# Patient Record
Sex: Male | Born: 1985
Health system: Southern US, Community
[De-identification: ages and names within clinical notes are randomized; demographics above are authoritative.]

## PROBLEM LIST (undated history)

## (undated) DIAGNOSIS — I809 Phlebitis and thrombophlebitis of unspecified site: Secondary | ICD-10-CM

## (undated) DIAGNOSIS — I456 Pre-excitation syndrome: Secondary | ICD-10-CM

## (undated) DIAGNOSIS — F3181 Bipolar II disorder: Secondary | ICD-10-CM

## (undated) HISTORY — PX: TONSILLECTOMY: SUR1361

---
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
## 2005-11-22 ENCOUNTER — Inpatient Hospital Stay (HOSPITAL_COMMUNITY): Admission: RE | Admit: 2005-11-22 | Discharge: 2005-11-29 | Payer: Self-pay | Admitting: Psychiatry

## 2005-11-23 ENCOUNTER — Ambulatory Visit: Payer: Self-pay | Admitting: Psychiatry

## 2005-12-05 ENCOUNTER — Ambulatory Visit (HOSPITAL_COMMUNITY): Payer: Self-pay | Admitting: Psychiatry

## 2005-12-06 ENCOUNTER — Emergency Department (HOSPITAL_COMMUNITY): Admission: EM | Admit: 2005-12-06 | Discharge: 2005-12-06 | Payer: Self-pay | Admitting: Emergency Medicine

## 2005-12-12 ENCOUNTER — Ambulatory Visit (HOSPITAL_COMMUNITY): Payer: Self-pay | Admitting: Psychiatry

## 2005-12-19 ENCOUNTER — Ambulatory Visit (HOSPITAL_COMMUNITY): Payer: Self-pay | Admitting: Psychiatry

## 2006-01-02 ENCOUNTER — Ambulatory Visit (HOSPITAL_COMMUNITY): Payer: Self-pay | Admitting: Psychiatry

## 2006-01-09 ENCOUNTER — Ambulatory Visit (HOSPITAL_COMMUNITY): Payer: Self-pay | Admitting: Psychiatry

## 2006-01-16 ENCOUNTER — Ambulatory Visit (HOSPITAL_COMMUNITY): Payer: Self-pay | Admitting: Psychiatry

## 2006-01-23 ENCOUNTER — Ambulatory Visit (HOSPITAL_COMMUNITY): Payer: Self-pay | Admitting: Psychiatry

## 2006-01-30 ENCOUNTER — Ambulatory Visit (HOSPITAL_COMMUNITY): Payer: Self-pay | Admitting: Psychiatry

## 2009-11-13 ENCOUNTER — Emergency Department (HOSPITAL_BASED_OUTPATIENT_CLINIC_OR_DEPARTMENT_OTHER): Admission: EM | Admit: 2009-11-13 | Discharge: 2009-11-14 | Payer: Self-pay | Admitting: Emergency Medicine

## 2009-11-14 ENCOUNTER — Ambulatory Visit: Payer: Self-pay | Admitting: Diagnostic Radiology

## 2011-02-23 NOTE — Discharge Summary (Signed)
NAME:  Jordan Lamb, Jordan Lamb NO.:  [id number]   MEDICAL RECORD NO.:  [id number]          PATIENT TYPE:  IPS   LOCATION:  0507                          FACILITY:  [facility]   PHYSICIAN:  Geoffery Lyons, M.D.      DATE OF BIRTH:  [birth year]   DATE OF ADMISSION:  11/22/2005  DATE OF DISCHARGE:  11/29/2005                                 DISCHARGE SUMMARY   CHIEF COMPLAINT AND PRESENT ILLNESS:  This was the first admission to [facility] for this 25 year old white male, single, voluntarily  admitted.  Endorsed life is bullshit; I wish I were dead.  He was brought  to the hospital by his father.  He lost it.  Expressed desire to die.  Long  history of dysfunction.  He was expelled from high school due to anger.  He  is banned from [facility] campus due to anger outbursts.  He claimed that he was  tired of it all and that he wished he was dead.  The mother is gay and came  out when he was 45.  He lives with her and her mate.  He claimed that the  mother's partner has been trying to get him kicked out of the house.  Things  have been escalating.  He had five charges pending.  Lost his license, car  was totaled, charged with possession of weed, spray paint at school,  conspiracy, trespassing, DUI.  Endorsed that he did not do these things but  he is paying for it.  He apparently has been having angry outbursts for six  months.  Claimed he had tried to kill himself before, overdosing,  suffocating.   PAST PSYCHIATRIC HISTORY:  First time at [facility].  No previous  treatment.  Had been diagnosed ADHD.  Had no previous inpatient treatment.  Has been diagnosed ADHD.  Has been on Concerta and Effexor.   ALCOHOL/DRUG HISTORY:  As already stated, denies active use of alcohol.  Does admit to marijuana, 2 blunts daily since age 79.  Only one experience  with cocaine when he was 18.   MEDICAL HISTORY:  Noncontributory.   MEDICATIONS:  None.   PHYSICAL EXAMINATION:   Performed and failed to show any acute findings.   LABORATORY DATA:  CBC with white blood cells 9.1, hemoglobin 16.7.  Blood  chemistry with sodium 136, potassium 3.7, glucose 35.  Liver enzymes with  SGOT 24, SGPT 19, total bilirubin 1.1, TSH 1.277.   MENTAL STATUS EXAM:  Fully alert male.  Irritable.  Somewhat disheveled.  Poor hygiene.  Speech normal in pace and tone.  Somewhat reserved, somewhat  guarded, pretty irritable.  Thought processes are logical and coherent.  Anger, fear of losing control.  Endorsed that his anger got out of control  and he even gets afraid of his own anger.  Aggressive ideas towards family  members, or anyone that would be in the way.   ADMISSION DIAGNOSES:  AXIS I:  Mood disorder not otherwise specified.  Attention-deficit hyperactivity disorder, marijuana abuse.  AXIS II:  No diagnosis.  AXIS III:  No diagnosis.  AXIS IV:  Moderate.  AXIS V:  GAF upon admission 32; highest GAF in the last year 60.   HOSPITAL COURSE:  He was admitted.  He was started in individual and group  psychotherapy.  He was given some Seroquel.  He was started on Depakote and  he was later started on Strattera.  He endorsed he had been diagnosed with  ADHD, history of anger spells, destructive, increased use of marijuana to  cope, to calm himself down, multiple past and recent events, parents got a  divorced after the mother got involved in a same-sex relationship.  Ongoing  conflict with the father and more recently conflict with the mother's  partner.  He was kicked out of school due to aggressive outbursts.  Family  history for mood disorder and substance abuse.  Older brother in recovery.  He was insightful in that he allows his family to trigger his anger.  Felt  that the father treated him like a child.  He went off on his father while  in the unit.  Very resentful.  Got upset with himself.  While in the unit,  he had a couple of episodes where he was up to a point of  losing control.  He was allowed to go back to his room and he was allowed to get himself back  together.  We addressed anger management.  We worked with the Depakote.  We  worked with the [employer].  There was a family session that showed some of  the dynamics that they went through and how they triggered each other.  They  decided they were willing to start all over and work on the relationships.  He continued to work on self-control, identifying the triggers.  He was more  euthymic.  He was able to get into individual and the group setting.  He was  able to participate.  On November 28, 2005, he endorsed he was committed to  make this work.  He felt he made a lot of progress.  It was felt he had  obtained full benefit from the hospitalization.  Understood that he needed  to continue to work on his anger but he was willing to continue the  medication.  He was in full contact with reality.  No active suicidal or  homicidal ideation.  Going to stay with his mother but eventually, once he  got tax return, he was going to get a car and he was going to move out and  continue to work at [employer] place and go back to school, get his diploma.  Eventually would like to pursue music at [employer].   DISCHARGE DIAGNOSES:  AXIS I:  Mood disorder not otherwise specified.  Attention-deficit hyperactivity disorder.  Marijuana abuse.  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  AXIS II:  No diagnosis.  AXIS III:  No diagnosis.  AXIS IV:  Moderate.  AXIS V:  GAF upon discharge 50.   DISCHARGE MEDICATIONS:  1.  Chantix 1 mg twice a day.  2.  Seroquel 100 mg at night.  3.  Depakote ER 250 mg twice a day, 500 mg at bedtime.  4.  Strattera 40 mg, 2 in the morning.   FOLLOW UP:  [facility].      Geoffery Lyons, M.D.  Electronically Signed     IL/MEDQ  D:  12/10/2005  T:  12/10/2005  Job:  [id number]

## 2011-07-08 ENCOUNTER — Encounter: Payer: Self-pay | Admitting: Emergency Medicine

## 2011-07-08 ENCOUNTER — Emergency Department (INDEPENDENT_AMBULATORY_CARE_PROVIDER_SITE_OTHER): Payer: 59

## 2011-07-08 ENCOUNTER — Emergency Department (HOSPITAL_BASED_OUTPATIENT_CLINIC_OR_DEPARTMENT_OTHER)
Admission: EM | Admit: 2011-07-08 | Discharge: 2011-07-08 | Disposition: A | Discharge status: Home / Self Care | Payer: 59 | Attending: Emergency Medicine | Admitting: Emergency Medicine

## 2011-07-08 DIAGNOSIS — W19XXXA Unspecified fall, initial encounter: Secondary | ICD-10-CM | POA: Insufficient documentation

## 2011-07-08 DIAGNOSIS — S335XXA Sprain of ligaments of lumbar spine, initial encounter: Secondary | ICD-10-CM | POA: Insufficient documentation

## 2011-07-08 DIAGNOSIS — S39012A Strain of muscle, fascia and tendon of lower back, initial encounter: Secondary | ICD-10-CM

## 2011-07-08 DIAGNOSIS — M545 Low back pain, unspecified: Secondary | ICD-10-CM

## 2011-07-08 HISTORY — DX: Phlebitis and thrombophlebitis of unspecified site: I80.9

## 2011-07-08 HISTORY — DX: Bipolar II disorder: F31.81

## 2011-07-08 MED ORDER — OXYCODONE-ACETAMINOPHEN 5-325 MG PO TABS: 1.0000 | ORAL_TABLET | Freq: Four times a day (QID) | ORAL | Status: AC | PRN

## 2011-07-08 NOTE — ED Provider Notes (Signed)
History     CSN: [id number] Arrival date & time: 07/08/2011 10:28 AM Patient seen at 10:28 AM Chief Complaint  Patient presents with  . Back Pain    (Consider location/radiation/quality/duration/timing/severity/associated sxs/prior treatment) Patient is a 25 y.o. male presenting with back pain. The history is provided by the patient.  Back Pain  This is a new problem. The current episode started more than 1 week ago. The problem occurs constantly. The problem has been gradually worsening. The pain is associated with falling. The pain is present in the lumbar spine. The quality of the pain is described as aching. The pain is moderate. The symptoms are aggravated by certain positions. Pertinent negatives include no chest pain, no fever, no numbness, no abdominal pain, no bowel incontinence, no bladder incontinence, no leg pain, no paresthesias, no paresis and no weakness. He has tried NSAIDs for the symptoms. The treatment provided no relief.   no history of back surgery  Past Medical History  Diagnosis Date  . Superficial thrombophlebitis     History reviewed. No pertinent past surgical history.  History reviewed. No pertinent family history.  History  Substance Use Topics  . Smoking status: Current Everyday Smoker -- 1.0 packs/day    Types: Cigarettes  . Smokeless tobacco: Not on file  . Alcohol Use: No      Review of Systems  Constitutional: Negative for fever.  Cardiovascular: Negative for chest pain.  Gastrointestinal: Negative for abdominal pain and bowel incontinence.  Genitourinary: Negative for bladder incontinence.  Musculoskeletal: Positive for back pain.  Neurological: Negative for weakness, numbness and paresthesias.  All other systems reviewed and are negative.    Allergies  Review of patient's allergies indicates not on file.  Home Medications  No current outpatient prescriptions on file.  BP 151/77  Pulse 91  Resp 16  SpO2 100%  Physical  Exam CONSTITUTIONAL: Well developed/well nourished HEAD AND FACE: Normocephalic/atraumatic EYES: EOMI/PERRL ENMT: Mucous membranes moist NECK: supple no meningeal signs SPINE: Cervical and thoracic spine nontender, lumbar spine tender, lumbar paraspinal tenderness No bruising crepitance or step-offs CV: S1/S2 noted, no murmurs/rubs/gallops noted LUNGS: Lungs are clear to auscultation bilaterally, no apparent distress ABDOMEN: soft, nontender, no rebound or guarding GU:no cva tenderness, no bruising NEURO: Pt is awake/alert, moves all extremitiesx4  equal distal motor: hip flexion/knee flexion/extension, ankle dorsi/plantar flexion, great toe extension intact bilaterally, Pt is able to ambulate.  He is able to stand on heels/toes without difficulty EXTREMITIES: pulses normal, full ROM SKIN: warm, color normal PSYCH: no abnormalities of mood noted    ED Course  Procedures (including critical care time)   MDM  Nursing notes reviewed and considered in documentation xrays reviewed and considered  Pt driving does not want pain med at this time 10:36 AM        Joya Gaskins, MD 07/08/11 1104

## 2011-07-08 NOTE — ED Notes (Signed)
Care plan and use of meds for pain reviewed Pt verbalizes care well

## 2011-07-08 NOTE — ED Notes (Signed)
Lower back pain x 2 weeks.  Pt states he performs yard work and has been taking Ibuprofen without much help.  No numbness or tingling in legs.  No difficulty with elimination.   Moves all extremeties well.

## 2011-07-26 ENCOUNTER — Emergency Department (INDEPENDENT_AMBULATORY_CARE_PROVIDER_SITE_OTHER): Payer: Worker's Compensation

## 2011-07-26 ENCOUNTER — Encounter (HOSPITAL_BASED_OUTPATIENT_CLINIC_OR_DEPARTMENT_OTHER): Payer: Self-pay | Admitting: *Deleted

## 2011-07-26 ENCOUNTER — Emergency Department (HOSPITAL_BASED_OUTPATIENT_CLINIC_OR_DEPARTMENT_OTHER)
Admission: EM | Admit: 2011-07-26 | Discharge: 2011-07-26 | Disposition: A | Discharge status: Home / Self Care | Payer: Worker's Compensation | Attending: Emergency Medicine | Admitting: Emergency Medicine

## 2011-07-26 DIAGNOSIS — S46911A Strain of unspecified muscle, fascia and tendon at shoulder and upper arm level, right arm, initial encounter: Secondary | ICD-10-CM

## 2011-07-26 DIAGNOSIS — IMO0002 Reserved for concepts with insufficient information to code with codable children: Secondary | ICD-10-CM | POA: Insufficient documentation

## 2011-07-26 DIAGNOSIS — X58XXXA Exposure to other specified factors, initial encounter: Secondary | ICD-10-CM | POA: Insufficient documentation

## 2011-07-26 DIAGNOSIS — F319 Bipolar disorder, unspecified: Secondary | ICD-10-CM | POA: Insufficient documentation

## 2011-07-26 DIAGNOSIS — M25519 Pain in unspecified shoulder: Secondary | ICD-10-CM

## 2011-07-26 DIAGNOSIS — F17200 Nicotine dependence, unspecified, uncomplicated: Secondary | ICD-10-CM | POA: Insufficient documentation

## 2011-07-26 MED ORDER — HYDROCODONE-ACETAMINOPHEN 5-325 MG PO TABS: 2.0000 | ORAL_TABLET | ORAL | Status: AC | PRN

## 2011-07-26 NOTE — ED Provider Notes (Signed)
History     CSN: [id number] Arrival date & time: 07/26/2011 12:47 PM   First MD Initiated Contact with Patient 07/26/11 1315      Chief Complaint  Patient presents with  . Shoulder Pain    (Consider location/radiation/quality/duration/timing/severity/associated sxs/prior treatment) Patient is a 25 y.o. male presenting with shoulder pain and shoulder injury.  Shoulder Pain  Shoulder Injury This is a new problem. The current episode started yesterday. The problem occurs 2 to 4 times per day. The problem has been gradually worsening. The symptoms are aggravated by nothing. He has tried nothing for the symptoms. The treatment provided moderate relief.    Past Medical History  Diagnosis Date  . Superficial thrombophlebitis   . Bipolar 2 disorder     History reviewed. No pertinent past surgical history.  History reviewed. No pertinent family history.  History  Substance Use Topics  . Smoking status: Current Everyday Smoker -- 1.0 packs/day    Types: Cigarettes  . Smokeless tobacco: Not on file  . Alcohol Use: No      Review of Systems  All other systems reviewed and are negative.    Allergies  Review of patient's allergies indicates no known allergies.  Home Medications   Current Outpatient Rx  Name Route Sig Dispense Refill  . ASPIRIN EC 81 MG PO TBEC Oral Take 81 mg by mouth daily.      . OMEGA-3 FATTY ACIDS 1000 MG PO CAPS Oral Take 2 g by mouth daily.      . IBUPROFEN 800 MG PO TABS Oral Take 800 mg by mouth every 8 (eight) hours as needed.      Marland Kitchen LAMOTRIGINE 100 MG PO TABS Oral Take 100 mg by mouth daily.      . MULTI-VITAMIN/MINERALS PO TABS Oral Take 1 tablet by mouth daily.        BP 144/73  Pulse 86  Resp 20  SpO2 99%  Physical Exam  Nursing note and vitals reviewed. Constitutional: He is oriented to person, place, and time. He appears well-developed and well-nourished.  HENT:  Head: Normocephalic and atraumatic.  Eyes: Conjunctivae are  normal. Pupils are equal, round, and reactive to light.  Neck: Normal range of motion. Neck supple.  Abdominal: Bowel sounds are normal.  Musculoskeletal: He exhibits tenderness.       Tender right shoulder,  From, ns nadnv intact  Neurological: He is alert and oriented to person, place, and time.    ED Course  Procedures (including critical care time Labs Reviewed - No data to display Dg Shoulder Right  07/26/2011  *RADIOLOGY REPORT*  Clinical Data: Right shoulder pain.  RIGHT SHOULDER - 2+ VIEW  Comparison: None  Findings: The joint spaces are maintained.  No acute bony findings. The right lung apex is clear.  IMPRESSION: No fracture or dislocation.  Original Report Authenticated By: P. Loralie Champagne, M.D.     No diagnosis found.    MDM  Pt counseled on need to follow up with orthopaedist        Langston Masker, [place] 07/26/11 1417

## 2011-07-26 NOTE — ED Notes (Signed)
Right shoulder dislocated states it "slipped out " twice yesterday and again about 3 hours ago

## 2011-07-26 NOTE — ED Provider Notes (Signed)
Medical screening examination/treatment/procedure(s) were performed by non-physician practitioner and as supervising physician I was immediately available for consultation/collaboration.   Rolan Bucco, MD 07/26/11 [phone]

## 2012-04-21 ENCOUNTER — Emergency Department (HOSPITAL_BASED_OUTPATIENT_CLINIC_OR_DEPARTMENT_OTHER): Payer: 59

## 2012-04-21 ENCOUNTER — Encounter (HOSPITAL_BASED_OUTPATIENT_CLINIC_OR_DEPARTMENT_OTHER): Payer: Self-pay | Admitting: Family Medicine

## 2012-04-21 ENCOUNTER — Emergency Department (HOSPITAL_BASED_OUTPATIENT_CLINIC_OR_DEPARTMENT_OTHER)
Admission: EM | Admit: 2012-04-21 | Discharge: 2012-04-21 | Disposition: A | Discharge status: Home / Self Care | Payer: 59 | Attending: Emergency Medicine | Admitting: Emergency Medicine

## 2012-04-21 DIAGNOSIS — W010XXA Fall on same level from slipping, tripping and stumbling without subsequent striking against object, initial encounter: Secondary | ICD-10-CM | POA: Insufficient documentation

## 2012-04-21 DIAGNOSIS — S4980XA Other specified injuries of shoulder and upper arm, unspecified arm, initial encounter: Secondary | ICD-10-CM | POA: Insufficient documentation

## 2012-04-21 DIAGNOSIS — F17200 Nicotine dependence, unspecified, uncomplicated: Secondary | ICD-10-CM | POA: Insufficient documentation

## 2012-04-21 DIAGNOSIS — M25519 Pain in unspecified shoulder: Secondary | ICD-10-CM

## 2012-04-21 DIAGNOSIS — F319 Bipolar disorder, unspecified: Secondary | ICD-10-CM | POA: Insufficient documentation

## 2012-04-21 DIAGNOSIS — S46909A Unspecified injury of unspecified muscle, fascia and tendon at shoulder and upper arm level, unspecified arm, initial encounter: Secondary | ICD-10-CM | POA: Insufficient documentation

## 2012-04-21 NOTE — ED Notes (Signed)
Pt sts he slipped and fell last week injury right shoulder. Pt sts shoulder popped out and back in but still has pain and limited range of motion. Pt reports h/o same.

## 2012-04-21 NOTE — ED Provider Notes (Signed)
History     CSN: [id number]  Arrival date & time 04/21/12  1028   First MD Initiated Contact with Patient 04/21/12 1121    room 3, hp  Chief Complaint  Patient presents with  . Shoulder Injury    (Consider location/radiation/quality/duration/timing/severity/associated sxs/prior treatment) HPI  Patient complaining of right shoulder pain. He states that he slipped a week ago and fell and that it was "out". He states he popped it back in. He states that he reached out for his strep door today and had a strange popping sensation and pain in his right shoulder. He states this has happened him many times. He states that he has had multiple shoulder dislocations that he usually puts it back in himself. He denies any numbness or tingling. He states he has not previously been seen by an orthopedist.  Past Medical History  Diagnosis Date  . Superficial thrombophlebitis   . Bipolar 2 disorder     Past Surgical History  Procedure Date  . Tonsillectomy     No family history on file.  History  Substance Use Topics  . Smoking status: Current Everyday Smoker -- 1.0 packs/day    Types: Cigarettes  . Smokeless tobacco: Not on file  . Alcohol Use: No      Review of Systems  All other systems reviewed and are negative.    Allergies  Review of patient's allergies indicates no known allergies.  Home Medications   Current Outpatient Rx  Name Route Sig Dispense Refill  . ASPIRIN EC 81 MG PO TBEC Oral Take 81 mg by mouth daily.      . OMEGA-3 FATTY ACIDS 1000 MG PO CAPS Oral Take 2 g by mouth daily.      . IBUPROFEN 800 MG PO TABS Oral Take 800 mg by mouth every 8 (eight) hours as needed.      Marland Kitchen LAMOTRIGINE 100 MG PO TABS Oral Take 100 mg by mouth daily.      . MULTI-VITAMIN/MINERALS PO TABS Oral Take 1 tablet by mouth daily.        BP 147/83  Pulse 70  Temp 98.1 F (36.7 C) (Oral)  Resp 20  Ht 6\' 3"  (1.905 m)  Wt 218 lb (98.884 kg)  BMI 27.25 kg/m2  SpO2 100%  Physical  Exam  Nursing note and vitals reviewed. Constitutional: He is oriented to person, place, and time. He appears well-developed and well-nourished.  HENT:  Head: Normocephalic and atraumatic.  Eyes: Conjunctivae are normal. Pupils are equal, round, and reactive to light.  Neck: Normal range of motion. Neck supple.  Musculoskeletal:       Patient with full active range of motion of right shoulder, right elbow, and right wrist. Patient is neurovascularly intact throughout the right upper extremity with radial pulses at 2+. Finger sensation is intact. He has no redness or swelling of the right upper extremity. There is no tenderness of the right shoulder on palpation and no crepitus noted.  Neurological: He is alert and oriented to person, place, and time. He has normal reflexes.  Skin: Skin is warm and dry.  Psychiatric: He has a normal mood and affect.    ED Course  Procedures (including critical care time)  Labs Reviewed - No data to display Dg Shoulder Right  04/21/2012  *RADIOLOGY REPORT*  Clinical Data: Fall with right shoulder pain and limited range of motion.  RIGHT SHOULDER - 2+ VIEW  Comparison: 07/27/2011.  Findings: No fracture or dislocation.  No significant  degenerative changes.  Visualized portion the right chest is unremarkable.  IMPRESSION: No acute findings.  Original Report Authenticated By: Reyes Ivan, M.D.     No diagnosis found.    MDM  Discussed care with the patient and counseled regarding smoking and smoking cessation. He has no current sign of shoulder dislocation. He'll be placed in a shoulder sling and referred to orthopedic physician for followup.        Hilario Quarry, MD 04/21/12 1135

## 2017-05-22 ENCOUNTER — Emergency Department (HOSPITAL_COMMUNITY)
Admission: EM | Admit: 2017-05-22 | Discharge: 2017-05-22 | Disposition: A | Discharge status: Home / Self Care | Payer: 59 | Attending: Emergency Medicine | Admitting: Emergency Medicine

## 2017-05-22 ENCOUNTER — Encounter (HOSPITAL_COMMUNITY): Payer: Self-pay | Admitting: Emergency Medicine

## 2017-05-22 DIAGNOSIS — F17210 Nicotine dependence, cigarettes, uncomplicated: Secondary | ICD-10-CM | POA: Insufficient documentation

## 2017-05-22 DIAGNOSIS — Y999 Unspecified external cause status: Secondary | ICD-10-CM | POA: Insufficient documentation

## 2017-05-22 DIAGNOSIS — Z79899 Other long term (current) drug therapy: Secondary | ICD-10-CM | POA: Insufficient documentation

## 2017-05-22 DIAGNOSIS — Z23 Encounter for immunization: Secondary | ICD-10-CM | POA: Insufficient documentation

## 2017-05-22 DIAGNOSIS — Z7982 Long term (current) use of aspirin: Secondary | ICD-10-CM | POA: Insufficient documentation

## 2017-05-22 DIAGNOSIS — S60511A Abrasion of right hand, initial encounter: Secondary | ICD-10-CM | POA: Insufficient documentation

## 2017-05-22 DIAGNOSIS — Y929 Unspecified place or not applicable: Secondary | ICD-10-CM | POA: Insufficient documentation

## 2017-05-22 DIAGNOSIS — Y9389 Activity, other specified: Secondary | ICD-10-CM | POA: Insufficient documentation

## 2017-05-22 DIAGNOSIS — S61011A Laceration without foreign body of right thumb without damage to nail, initial encounter: Secondary | ICD-10-CM | POA: Insufficient documentation

## 2017-05-22 DIAGNOSIS — W261XXA Contact with sword or dagger, initial encounter: Secondary | ICD-10-CM | POA: Insufficient documentation

## 2017-05-22 DIAGNOSIS — Z791 Long term (current) use of non-steroidal anti-inflammatories (NSAID): Secondary | ICD-10-CM | POA: Insufficient documentation

## 2017-05-22 MED ORDER — LIDOCAINE HCL (PF) 1 % IJ SOLN
5.0000 mL | Freq: Once | INTRAMUSCULAR | Status: AC
  Administered 2017-05-22: 5 mL
  Filled 2017-05-22: qty 5

## 2017-05-22 MED ORDER — TETANUS-DIPHTH-ACELL PERTUSSIS 5-2.5-18.5 LF-MCG/0.5 IM SUSP
0.5000 mL | Freq: Once | INTRAMUSCULAR | Status: AC
  Administered 2017-05-22: 0.5 mL via INTRAMUSCULAR
  Filled 2017-05-22: qty 0.5

## 2017-05-22 NOTE — ED Provider Notes (Signed)
LACERATION REPAIR Performed by: Kellie ShropshireEmily J Shrosbree Authorized by: Kellie ShropshireEmily J Shrosbree Consent: Verbal consent obtained. Risks and benefits: risks, benefits and alternatives were discussed Consent given by: patient Patient identity confirmed: provided demographic data Prepped and Draped in normal sterile fashion Wound explored  Laceration Location: Right base of thumb  Laceration Length: 1cm  No Foreign Bodies seen or palpated  Anesthesia: local infiltration  Local anesthetic: lidocaine 1% without epinephrine  Anesthetic total: 3ml  Irrigation method: syringe Amount of cleaning: standard  Skin closure: 5-0 prolene  Number of sutures: 2  Technique: simple interrupted  Patient tolerance: Patient tolerated the procedure well with no immediate complications.    Kellie ShropshireShrosbree, Emily J, PA-C 05/22/17 2145    Donnetta Hutchingook, Brian, MD 05/25/17 0930

## 2017-05-22 NOTE — ED Provider Notes (Signed)
[facility]-EMERGENCY DEPT Provider Note   CSN: [id number] Arrival date & time: 05/22/17  1933  By signing my name below, I, Rosana Fret, attest that this documentation has been prepared under the direction and in the presence of non-physician practitioner, Leary Roca, PA-C. Electronically Signed: Rosana Fret, ED Scribe. 05/22/17. 8:44 PM.  History   Chief Complaint Chief Complaint  Patient presents with  . Laceration   The history is provided by the patient. No language interpreter was used.   HPI Comments: Jordan Lamb is a 31 y.o. right hand dominant male who presents to the Emergency Department for laceration at the base of his right thumb that occred ~6 hours ago. Pt grabbed a sword by the blade and not the handle, cutting his hand. He reports some discomfort/mild pain in his hand. Tetanus status is unknown. Pt also has mild swelling and abrasions over the right knuckles that occurred yesterday after punching a wall. Pt denies numbness/tingling or any other complaints at this time.  Past Medical History:  Diagnosis Date  . Bipolar 2 disorder (HCC)   . Superficial thrombophlebitis     There are no active problems to display for this patient.   Past Surgical History:  Procedure Laterality Date  . TONSILLECTOMY         Home Medications    Prior to Admission medications   Medication Sig Start Date End Date Taking? Authorizing Provider  aspirin EC 81 MG tablet Take 81 mg by mouth daily.      [provider]  fish oil-omega-3 fatty acids 1000 MG capsule Take 2 g by mouth daily.      [provider]  ibuprofen (ADVIL,MOTRIN) 800 MG tablet Take 800 mg by mouth every 8 (eight) hours as needed.      [provider]  lamoTRIgine (LAMICTAL) 100 MG tablet Take 100 mg by mouth daily.      [provider]  Multiple Vitamins-Minerals (MULTIVITAMIN WITH MINERALS) tablet Take 1 tablet by mouth daily.      [provider]     Family History History reviewed. No pertinent family history.  Social History Social History  Substance Use Topics  . Smoking status: Current Every Day Smoker    Packs/day: 1.00    Types: Cigarettes  . Smokeless tobacco: Not on file  . Alcohol use No     Allergies   Patient has no known allergies.   Review of Systems Review of Systems  Constitutional: Negative for fever.  Musculoskeletal: Positive for myalgias.  Skin: Positive for wound.  Neurological: Negative for numbness.     Physical Exam Updated Vital Signs BP 132/85 (BP Location: Left Arm)   Pulse 82   Temp 98.3 F (36.8 C) (Oral)   Resp (!) 24   Ht 6\' 3"  (1.905 m)   Wt 83.6 kg (184 lb 5 oz)   SpO2 99%   BMI 23.04 kg/m   Physical Exam  Constitutional: He is oriented to person, place, and time. He appears well-developed and well-nourished.  HENT:  Head: Normocephalic and atraumatic.  Right Ear: External ear normal.  Left Ear: External ear normal.  Eyes: Conjunctivae are normal. Right eye exhibits no discharge. Left eye exhibits no discharge. No scleral icterus.  Neck: Normal range of motion. Neck supple. No spinous process tenderness present.  Cardiovascular: Normal rate.   Pulmonary/Chest: Effort normal. No respiratory distress.  Musculoskeletal:       Right wrist: Normal.  Right hand: 1cm laceration to the base of  right thumb. No exposed tendons or structures. Bleeding controlled. Noted abrasions to 3rd and 4th knuckles. No obvious deformity. No loss of 4th or 5th knuckle.  Fingers appear normal. No TTP over flexor sheath. Radial artery 2+ with <2sec cap refill. SILT in M/U/R distributions. Dynamic 2-pt discrimination intact over median and ulnar nerve distributions on the ulnar/radial aspects of digits. Sharp/dull sensation intact at dorsal 1st webspace. Grip 5/5 strength. Finger adduction/abduction intact with 5/5 strength.  Thumb opposition intact. Full ROM to Flexion/Extension at wrist, MCP, PIP  and DIP.  FDS/FDP intact.   Neurological: He is alert and oriented to person, place, and time.  Skin: Skin is warm and dry. No pallor.  Psychiatric: He has a normal mood and affect.  Nursing note and vitals reviewed.    ED Treatments / Results  DIAGNOSTIC STUDIES: Oxygen Saturation is 99% on RA, normal by my interpretation.   COORDINATION OF CARE: 8:38 PM-Discussed next steps with pt including proper wound care. Pt verbalized understanding and is agreeable with the plan.   Labs (all labs ordered are listed, but only abnormal results are displayed) Labs Reviewed - No data to display  EKG  EKG Interpretation None       Radiology No results found.  Procedures Procedures (including critical care time)  Medications Ordered in ED Medications  lidocaine (PF) (XYLOCAINE) 1 % injection 5 mL (5 mLs Infiltration Given by Other 05/22/17 2129)  Tdap (BOOSTRIX) injection 0.5 mL (0.5 mLs Intramuscular Given 05/22/17 2129)     Initial Impression / Assessment and Plan / ED Course  I have reviewed the triage vital signs and the nursing notes.  Pertinent labs & imaging results that were available during my care of the patient were reviewed by me and considered in my medical decision making (see chart for details).     31 year old male with 1cm laceration to base of right thumb. He is right hand dominant. There is also abrasion to 3rd and 4th knuckle of the hand from where patient punched wall yesterday. No signs of boxers fx. No TTP. No scissoring on exam of fingers. Hand exam reassuring. Hand soaked in betadine solution. Pressure irrigation performed. Wound explored and base of wound visualized in a bloodless field without evidence of foreign body.  Laceration occurred < 8 hours prior to repair which was well tolerated. Laceration repair performed by Shirlyn GoltzEmily Shrosbree, PA-C. Please see her note for additional details. Tdap updated.  Pt has no comorbidities to effect normal wound healing.  Pt discharged without antibiotics.  Discussed suture home care with patient and answered questions. Pt to follow-up for wound check and suture removal in 7 days; they are to return to the ED sooner for signs of infection. Pt is hemodynamically stable with no complaints prior to dc.   Final Clinical Impressions(s) / ED Diagnoses   Final diagnoses:  Laceration of right thumb without foreign body without damage to nail, initial encounter    New Prescriptions New Prescriptions   No medications on file   I personally performed the services described in this documentation, which was scribed in my presence. The recorded information has been reviewed and is accurate.       Jacinto HalimMaczis, Michael M, PA-C 05/22/17 2158    Jacinto HalimMaczis, Michael M, PA-C 05/22/17 2200    Donnetta Hutchingook, Brian, MD 05/25/17 0930

## 2017-05-22 NOTE — Discharge Instructions (Signed)
Follow up with your doctor, an urgent care, or this Emergency Department for removal of your stitches in 7 days. Do not submerge the stitches in water for the first 24 hours. Take your pain medication as prescribed. Do not operate heavy machinery while on pain medication.  ° °If you were given pain medication that contains acetaminophen (Tylenol) such as Vicodin or Percocet, it is not reccommended that you use additional acetaminophen (Tylenol) while taking this medication.  ° °TREATMENT  °Keep the wound clean and dry for the next 24 hours and leave the dressing in place. You may shower after 24 hours. Do not soak the area for long periods of times as in a bath until the sutures are removed. °After 24 hours you may remove the dressing and gently clean the laceration site with antibacterial soap and warm water 2 times a day. Pat dry with clean towel. Do not scrub. °Once the wound has healed, scarring can be minimized by covering the wound with sunscreen during the day for 1 full year. ° °SEEK MEDICAL CARE IF:  °You have redness, swelling, or increasing pain in the wound.  °You see a red line that goes away from the wound.  °You have yellowish-white fluid (pus) coming from the wound.  °You have a fever.  °You notice a bad smell coming from the wound or dressing.  °Your wound breaks open before or after sutures have been removed.  °You notice something coming out of the wound such as wood or glass.  °Your wound is on your hand or foot and you cannot move a finger or toe.  °Your pain is not controlled with prescribed medicine.  ° °If you did not receive a tetanus shot today because you thought you were up to date, but did not recall when your last one was given, make sure to check with your primary caregiver to determine if you need one. ° ° ° °

## 2017-05-22 NOTE — ED Triage Notes (Signed)
Pt presents with lacerations and abrasions to R hand, R thumb base; pt states occurred 3 hr PTA; pt unsure of last tetanus vac

## 2017-05-27 ENCOUNTER — Encounter: Payer: Self-pay | Admitting: *Deleted

## 2017-05-27 NOTE — Congregational Nurse Program (Signed)
08202018/late entry for 08152018/pt presented to church office with a small deep cut to the left hand/advised to go to ED for suturing.  Patient went to the ER and required two sutures to the web area of the hand.  Will return to er for removal.

## 2017-08-30 ENCOUNTER — Encounter: Payer: Self-pay | Admitting: *Deleted

## 2017-08-30 NOTE — Congregational Nurse Program (Signed)
[id number]/Jordan Lamb,BSN,RN3,CCM,CN Patient referred to Patient care clinic with [facility] for continued depression.  Appointment made for [id number] at 0930.

## 2017-09-11 ENCOUNTER — Ambulatory Visit: Payer: Self-pay | Admitting: Family Medicine

## 2017-11-14 ENCOUNTER — Other Ambulatory Visit: Payer: Self-pay

## 2017-11-14 ENCOUNTER — Encounter (HOSPITAL_BASED_OUTPATIENT_CLINIC_OR_DEPARTMENT_OTHER): Payer: Self-pay | Admitting: Emergency Medicine

## 2017-11-14 ENCOUNTER — Emergency Department (HOSPITAL_BASED_OUTPATIENT_CLINIC_OR_DEPARTMENT_OTHER)
Admission: EM | Admit: 2017-11-14 | Discharge: 2017-11-14 | Disposition: A | Discharge status: Home / Self Care | Payer: 59 | Attending: Emergency Medicine | Admitting: Emergency Medicine

## 2017-11-14 DIAGNOSIS — Z7982 Long term (current) use of aspirin: Secondary | ICD-10-CM | POA: Insufficient documentation

## 2017-11-14 DIAGNOSIS — F17210 Nicotine dependence, cigarettes, uncomplicated: Secondary | ICD-10-CM | POA: Insufficient documentation

## 2017-11-14 DIAGNOSIS — Z79899 Other long term (current) drug therapy: Secondary | ICD-10-CM | POA: Insufficient documentation

## 2017-11-14 DIAGNOSIS — I8002 Phlebitis and thrombophlebitis of superficial vessels of left lower extremity: Secondary | ICD-10-CM | POA: Insufficient documentation

## 2017-11-14 MED ORDER — IBUPROFEN 800 MG PO TABS: 800.0000 mg | ORAL_TABLET | Freq: Three times a day (TID) | ORAL | 0 refills | Status: DC | PRN

## 2017-11-14 MED ORDER — AMOXICILLIN 500 MG PO CAPS: 500.0000 mg | ORAL_CAPSULE | Freq: Three times a day (TID) | ORAL | 0 refills | Status: DC

## 2017-11-14 MED FILL — AMOXICILLIN 500 MG CAPSULE: 500 | 7 days supply | Qty: 21 | Fill #0
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
## 2017-12-04 ENCOUNTER — Ambulatory Visit: Payer: Self-pay | Admitting: Nurse Practitioner

## 2019-01-26 ENCOUNTER — Encounter (HOSPITAL_BASED_OUTPATIENT_CLINIC_OR_DEPARTMENT_OTHER): Payer: Self-pay

## 2019-01-26 ENCOUNTER — Emergency Department (HOSPITAL_BASED_OUTPATIENT_CLINIC_OR_DEPARTMENT_OTHER)
Admission: EM | Admit: 2019-01-26 | Discharge: 2019-01-27 | Disposition: A | Discharge status: Home / Self Care | Payer: 59 | Attending: Emergency Medicine | Admitting: Emergency Medicine

## 2019-01-26 ENCOUNTER — Other Ambulatory Visit: Payer: Self-pay

## 2019-01-26 DIAGNOSIS — Z79899 Other long term (current) drug therapy: Secondary | ICD-10-CM | POA: Insufficient documentation

## 2019-01-26 DIAGNOSIS — R030 Elevated blood-pressure reading, without diagnosis of hypertension: Secondary | ICD-10-CM | POA: Insufficient documentation

## 2019-01-26 DIAGNOSIS — F17210 Nicotine dependence, cigarettes, uncomplicated: Secondary | ICD-10-CM | POA: Insufficient documentation

## 2019-01-26 DIAGNOSIS — Z7982 Long term (current) use of aspirin: Secondary | ICD-10-CM | POA: Insufficient documentation

## 2019-01-26 DIAGNOSIS — L02213 Cutaneous abscess of chest wall: Secondary | ICD-10-CM | POA: Insufficient documentation

## 2019-01-26 DIAGNOSIS — I456 Pre-excitation syndrome: Secondary | ICD-10-CM | POA: Insufficient documentation

## 2019-01-26 MED ORDER — LIDOCAINE-EPINEPHRINE (PF) 2 %-1:200000 IJ SOLN
10.0000 mL | Freq: Once | INTRAMUSCULAR | Status: AC
  Administered 2019-01-26: 10 mL
  Filled 2019-01-26 (×2): qty 10

## 2019-01-26 MED ORDER — DOXYCYCLINE HYCLATE 100 MG PO TABS
100.0000 mg | ORAL_TABLET | Freq: Once | ORAL | Status: AC
  Administered 2019-01-27: 100 mg via ORAL
  Filled 2019-01-26: qty 1

## 2019-01-26 MED ORDER — DOXYCYCLINE HYCLATE 100 MG PO CAPS: 100.0000 mg | ORAL_CAPSULE | Freq: Two times a day (BID) | ORAL | 0 refills | Status: DC

## 2019-01-26 NOTE — ED Provider Notes (Signed)
[facility] EMERGENCY DEPARTMENT Provider Note   CSN: [id number] Arrival date & time: 01/26/19  2232    History   Chief Complaint Chief Complaint  Patient presents with  . Abscess    HPI Jordan Lamb is a 33 y.o. male.   The history is provided by the patient.  He has a history of bipolar disorder and comes in complaining of an abscess in the right lateral chest wall for the last 3 days.  It had gotten very painful, and he squeezed it and got a bunch of pus out.  He also states that it was very painful to squeeze it and he had a brief syncopal episode.  He is feeling better now, and pain is down to 6/10.  He denies fever, chills, sweats.  Past Medical History:  Diagnosis Date  . Bipolar 2 disorder (HCC)   . Superficial thrombophlebitis     There are no active problems to display for this patient.   Past Surgical History:  Procedure Laterality Date  . TONSILLECTOMY          Home Medications    Prior to Admission medications   Medication Sig Start Date End Date Taking? Authorizing Provider  amoxicillin (AMOXIL) 500 MG capsule Take 1 capsule (500 mg total) by mouth 3 (three) times daily. 11/14/17   Fayrene Helper, PA-C  aspirin EC 81 MG tablet Take 81 mg by mouth daily.      [provider]  fish oil-omega-3 fatty acids 1000 MG capsule Take 2 g by mouth daily.      [provider]  ibuprofen (ADVIL,MOTRIN) 800 MG tablet Take 1 tablet (800 mg total) by mouth every 8 (eight) hours as needed for fever or moderate pain. 11/14/17   Fayrene Helper, PA-C  lamoTRIgine (LAMICTAL) 100 MG tablet Take 100 mg by mouth daily.      [provider]  Multiple Vitamins-Minerals (MULTIVITAMIN WITH MINERALS) tablet Take 1 tablet by mouth daily.      [provider]    Family History No family history on file.  Social History Social History   Tobacco Use  . Smoking status: Current Some Day Smoker    Packs/day: 1.00    Types: Cigarettes  .  Smokeless tobacco: Never Used  Substance Use Topics  . Alcohol use: Yes  . Drug use: Yes    Types: Marijuana     Allergies   Patient has no known allergies.   Review of Systems Review of Systems  All other systems reviewed and are negative.    Physical Exam Updated Vital Signs BP (!) 149/91 (BP Location: Left Arm)   Pulse 94   Temp 98.7 F (37.1 C) (Oral)   Resp 20   Ht 6\' 3"  (1.905 m)   Wt 78.9 kg   SpO2 99%   BMI 21.75 kg/m   Physical Exam Vitals signs and nursing note reviewed.    33 year old male, resting comfortably and in no acute distress. Vital signs are significant for elevated blood pressure. Oxygen saturation is 99%, which is normal. Head is normocephalic and atraumatic. PERRLA, EOMI. Oropharynx is clear. Neck is nontender and supple without adenopathy or JVD. Back is nontender and there is no CVA tenderness. Lungs are clear without rales, wheezes, or rhonchi. Chest: Erythematous raised area right lateral chest wall consistent with abscess.  There is an area of induration in the central part of this. Heart has regular rate and rhythm without murmur. Abdomen is soft,  flat, nontender without masses or hepatosplenomegaly and peristalsis is normoactive. Extremities have no cyanosis or edema, full range of motion is present. Skin is warm and dry without rash. Neurologic: Mental status is normal, cranial nerves are intact, there are no motor or sensory deficits.  ED Treatments / Results   Procedures .Marland Kitchen.Incision and Drainage Date/Time: 01/26/2019 11:45 PM Performed by: Dione BoozeGlick, Kailoni Vahle, MD Authorized by: Dione BoozeGlick, Kentrell Guettler, MD   Consent:    Consent obtained:  Verbal   Consent given by:  Patient   Risks discussed:  Bleeding, incomplete drainage and pain   Alternatives discussed:  Alternative treatment Location:    Type:  Abscess   Size:  2 cm   Location:  Trunk   Trunk location:  Chest Anesthesia (see MAR for exact dosages):    Anesthesia method:  Local  infiltration   Local anesthetic:  Lidocaine 2% WITH epi Procedure type:    Complexity:  Complex Procedure details:    Needle aspiration: no     Incision types:  Cruciate   Incision depth:  Subcutaneous   Scalpel blade:  11   Wound management:  Probed and deloculated   Drainage:  Purulent   Drainage amount:  Scant   Wound treatment:  Wound left open Post-procedure details:    Patient tolerance of procedure:  Tolerated well, no immediate complications    EKG Interpretation  Date/Time:  Monday January 26 2019 23:28:55 EDT Ventricular Rate:  86 PR Interval:    QRS Duration: 109 QT Interval:  380 QTC Calculation: 455 R Axis:   9 Text Interpretation:  Sinus rhythm Vent pre-excitat'n(WPW), left acces'y pathway Baseline wander in lead(s) V1 No old tracing to compare Confirmed by Dione BoozeGlick, Nikoloz Huy ([id number]) on 01/26/2019 11:38:27 PM        Medications Ordered in ED Medications  doxycycline (VIBRA-TABS) tablet 100 mg (has no administration in time range)  lidocaine-EPINEPHrine (XYLOCAINE W/EPI) 2 %-1:200000 (PF) injection 10 mL (10 mLs Infiltration Given by Other 01/26/19 2320)     Initial Impression / Assessment and Plan / ED Course  I have reviewed the triage vital signs and the nursing notes.  Abscess of the right chest wall, treated with incision and drainage.  Syncopal episode was probably vasovagal.  Will check ECG.  Old records are reviewed, and he has no relevant past visits.  ECG shows ventricular preexcitation.  I have asked the patient about symptoms of tachyarrhythmias, and he has not had any.  This is likely an incidental finding and not related to a syncopal episode.  Abscess is incised and drained with small amount of purulent material expressed.  He is discharged with prescription for doxycycline.  Advised to have his blood pressure rechecked in the next 1-2 weeks.  Final Clinical Impressions(s) / ED Diagnoses   Final diagnoses:  Abscess of chest wall  Elevated  blood-pressure reading without diagnosis of hypertension  Pre-excitation syndrome    ED Discharge Orders         Ordered    doxycycline (VIBRAMYCIN) 100 MG capsule  2 times daily     01/26/19 2350           Dione BoozeGlick, Jiovanni Heeter, MD 01/26/19 2353

## 2019-01-26 NOTE — ED Notes (Signed)
Pt has a raised area on right side of chest

## 2019-01-26 NOTE — Discharge Instructions (Addendum)
Your blood pressure was elevated today.  Please have it rechecked in the next 1-2 weeks.  If it stays persistently elevated, you will need to be on medication to control it.

## 2019-01-26 NOTE — ED Triage Notes (Signed)
Pt reports he had a pimple on his R chest to midaxilla region. Pt states the site has become larger. Pt reports he squeezed the area which had purulent drainage. Pt states he had a syncopal episode this evening.

## 2019-01-27 NOTE — ED Notes (Signed)
PT states understanding of care given, follow up care, and medication prescribed. PT ambulated from ED to car with a steady gait. 

## 2019-02-16 ENCOUNTER — Emergency Department (HOSPITAL_BASED_OUTPATIENT_CLINIC_OR_DEPARTMENT_OTHER)
Admission: EM | Admit: 2019-02-16 | Discharge: 2019-02-16 | Disposition: A | Discharge status: Home / Self Care | Payer: Self-pay | Attending: Emergency Medicine | Admitting: Emergency Medicine

## 2019-02-16 ENCOUNTER — Encounter (HOSPITAL_BASED_OUTPATIENT_CLINIC_OR_DEPARTMENT_OTHER): Payer: Self-pay | Admitting: *Deleted

## 2019-02-16 ENCOUNTER — Other Ambulatory Visit: Payer: Self-pay

## 2019-02-16 DIAGNOSIS — Z87891 Personal history of nicotine dependence: Secondary | ICD-10-CM | POA: Insufficient documentation

## 2019-02-16 DIAGNOSIS — R1111 Vomiting without nausea: Secondary | ICD-10-CM

## 2019-02-16 DIAGNOSIS — R112 Nausea with vomiting, unspecified: Secondary | ICD-10-CM | POA: Insufficient documentation

## 2019-02-16 NOTE — ED Provider Notes (Signed)
[facility] EMERGENCY DEPARTMENT Provider Note   CSN: [id number] Arrival date & time: 02/16/19  0039    History   Chief Complaint Chief Complaint  Patient presents with  . work note    HPI Jordan Lamb is a 33 y.o. male.     HPI  This is a 33 year old male who presents requesting a work note.  Patient reports that he was at work on Friday night when he had one episode of nonbilious, nonbloody emesis.  It was 45 minutes after he ate.  He went home.  He had no associated abdominal pain, fevers.  He has had no further symptoms throughout the weekend and no known sick contacts.  He states that his employer called him and told him that he needed a work note to return to work.  He currently is without complaint.  Past Medical History:  Diagnosis Date  . Bipolar 2 disorder (HCC)   . Superficial thrombophlebitis     There are no active problems to display for this patient.   Past Surgical History:  Procedure Laterality Date  . TONSILLECTOMY          Home Medications    Prior to Admission medications   Medication Sig Start Date End Date Taking? Authorizing Provider  doxycycline (VIBRAMYCIN) 100 MG capsule Take 1 capsule (100 mg total) by mouth 2 (two) times daily. 01/26/19  Yes Dione BoozeGlick, David, MD  aspirin EC 81 MG tablet Take 81 mg by mouth daily.      [provider]  fish oil-omega-3 fatty acids 1000 MG capsule Take 2 g by mouth daily.      [provider]  ibuprofen (ADVIL,MOTRIN) 800 MG tablet Take 1 tablet (800 mg total) by mouth every 8 (eight) hours as needed for fever or moderate pain. 11/14/17   Fayrene Helperran, Bowie, PA-C  lamoTRIgine (LAMICTAL) 100 MG tablet Take 100 mg by mouth daily.      [provider]  Multiple Vitamins-Minerals (MULTIVITAMIN WITH MINERALS) tablet Take 1 tablet by mouth daily.      [provider]    Family History No family history on file.  Social History Social History   Tobacco Use  . Smoking  status: Former Smoker    Packs/day: 1.00    Types: Cigarettes  . Smokeless tobacco: Never Used  Substance Use Topics  . Alcohol use: Not Currently  . Drug use: Not Currently    Types: Marijuana     Allergies   Patient has no known allergies.   Review of Systems Review of Systems  Constitutional: Negative for fever.  Respiratory: Negative for cough and shortness of breath.   Cardiovascular: Negative for chest pain.  Gastrointestinal: Positive for vomiting. Negative for abdominal pain and diarrhea.  Genitourinary: Negative for dysuria.  All other systems reviewed and are negative.    Physical Exam Updated Vital Signs BP (!) 143/87 (BP Location: Right Arm)   Pulse 70   Temp 98.6 F (37 C) (Oral)   Resp 18   Ht 1.905 m (6\' 3" )   Wt 79.8 kg   SpO2 99%   BMI 22.00 kg/m   Physical Exam Vitals signs and nursing note reviewed.  Constitutional:      General: He is not in acute distress.    Appearance: He is well-developed. He is not ill-appearing.  HENT:     Head: Normocephalic and atraumatic.  Cardiovascular:     Rate and Rhythm: Normal rate and regular rhythm.  Heart sounds: No murmur.  Pulmonary:     Effort: Pulmonary effort is normal. No respiratory distress.  Musculoskeletal:        General: No deformity.  Skin:    General: Skin is warm and dry.  Neurological:     Mental Status: He is alert and oriented to person, place, and time.  Psychiatric:        Mood and Affect: Mood normal.      ED Treatments / Results  Labs (all labs ordered are listed, but only abnormal results are displayed) Labs Reviewed - No data to display  EKG None  Radiology No results found.  Procedures Procedures (including critical care time)  Medications Ordered in ED Medications - No data to display   Initial Impression / Assessment and Plan / ED Course  I have reviewed the triage vital signs and the nursing notes.  Pertinent labs & imaging results that were  available during my care of the patient were reviewed by me and considered in my medical decision making (see chart for details).        Patient presents requesting a work note at the request of his employer.  He has no complaints and vomited once over 48 hours ago.  I discussed with him that I would provide him with a note work note but that coming to the ED during a pandemic puts him at unnecessary risk.  Patient stated understanding.  After history, exam, and medical workup I feel the patient has been appropriately medically screened and is safe for discharge home. Pertinent diagnoses were discussed with the patient. Patient was given return precautions.   Final Clinical Impressions(s) / ED Diagnoses   Final diagnoses:  Non-intractable vomiting without nausea, unspecified vomiting type    ED Discharge Orders    None       Shon Baton, MD 02/16/19 0101

## 2019-02-16 NOTE — ED Notes (Signed)
Patient states he had a single episode of emesis approximately 45 minutes post lunch. Patient states he has no other symptoms since. Patient is alert, oriented x 4 with stable VS.

## 2019-02-16 NOTE — ED Triage Notes (Signed)
Pt had episode at work on Friday where he vomited. Needs a note stating he can return

## 2019-07-17 ENCOUNTER — Other Ambulatory Visit: Payer: Self-pay

## 2019-07-17 ENCOUNTER — Encounter (HOSPITAL_BASED_OUTPATIENT_CLINIC_OR_DEPARTMENT_OTHER): Payer: Self-pay | Admitting: *Deleted

## 2019-07-17 ENCOUNTER — Emergency Department (HOSPITAL_BASED_OUTPATIENT_CLINIC_OR_DEPARTMENT_OTHER)
Admission: EM | Admit: 2019-07-17 | Discharge: 2019-07-17 | Disposition: A | Discharge status: Home / Self Care | Payer: Self-pay | Attending: Emergency Medicine | Admitting: Emergency Medicine

## 2019-07-17 DIAGNOSIS — Z7982 Long term (current) use of aspirin: Secondary | ICD-10-CM | POA: Insufficient documentation

## 2019-07-17 DIAGNOSIS — Z79899 Other long term (current) drug therapy: Secondary | ICD-10-CM | POA: Insufficient documentation

## 2019-07-17 DIAGNOSIS — R112 Nausea with vomiting, unspecified: Secondary | ICD-10-CM | POA: Insufficient documentation

## 2019-07-17 LAB — COMPREHENSIVE METABOLIC PANEL
ALT: 16 U/L (ref 0–44)
AST: 19 U/L (ref 15–41)
Albumin: 3.8 g/dL (ref 3.5–5.0)
Alkaline Phosphatase: 50 U/L (ref 38–126)
Anion gap: 9 (ref 5–15)
BUN: 12 mg/dL (ref 6–20)
CO2: 25 mmol/L (ref 22–32)
Calcium: 9.1 mg/dL (ref 8.9–10.3)
Chloride: 105 mmol/L (ref 98–111)
Creatinine, Ser: 0.94 mg/dL (ref 0.61–1.24)
GFR calc Af Amer: >60 mL/min (ref >60)
GFR calc non Af Amer: >60 mL/min (ref >60)
Glucose, Bld: 94 mg/dL (ref 70–99)
Potassium: 3.9 mmol/L (ref 3.5–5.1)
Sodium: 139 mmol/L (ref 135–145)
Total Bilirubin: 0.7 mg/dL (ref 0.3–1.2)
Total Protein: 6.3 g/dL — ABNORMAL LOW (ref 6.5–8.1)

## 2019-07-17 LAB — CBC WITH DIFFERENTIAL/PLATELET
Abs Immature Granulocytes: 0.03 10*3/uL (ref 0.00–0.07)
Basophils Absolute: 0.1 10*3/uL (ref 0.0–0.1)
Basophils Relative: 1 %
Eosinophils Absolute: 0.3 10*3/uL (ref 0.0–0.5)
Eosinophils Relative: 2 %
HCT: 46.9 % (ref 39.0–52.0)
Hemoglobin: 14.9 g/dL (ref 13.0–17.0)
Immature Granulocytes: 0 %
Lymphocytes Relative: 24 %
Lymphs Abs: 2.7 10*3/uL (ref 0.7–4.0)
MCH: 28.7 pg (ref 26.0–34.0)
MCHC: 31.8 g/dL (ref 30.0–36.0)
MCV: 90.2 fL (ref 80.0–100.0)
Monocytes Absolute: 0.9 10*3/uL (ref 0.1–1.0)
Monocytes Relative: 8 %
Neutro Abs: 7.5 10*3/uL (ref 1.7–7.7)
Neutrophils Relative %: 65 %
Platelets: 257 10*3/uL (ref 150–400)
RBC: 5.2 MIL/uL (ref 4.22–5.81)
RDW: 14.6 % (ref 11.5–15.5)
WBC: 11.4 10*3/uL — ABNORMAL HIGH (ref 4.0–10.5)
nRBC: 0 % (ref 0.0–0.2)

## 2019-07-17 LAB — LIPASE, BLOOD: Lipase: 23 U/L (ref 11–51)

## 2019-07-17 MED ORDER — SODIUM CHLORIDE 0.9 % IV BOLUS
1000.0000 mL | Freq: Once | INTRAVENOUS | Status: AC
  Administered 2019-07-17: 20:00:00 1000 mL via INTRAVENOUS

## 2019-07-17 MED ORDER — ONDANSETRON HCL 4 MG/2ML IJ SOLN
4.0000 mg | Freq: Once | INTRAMUSCULAR | Status: AC
  Administered 2019-07-17: 4 mg via INTRAVENOUS
  Filled 2019-07-17: qty 2

## 2019-07-17 MED ORDER — ONDANSETRON 4 MG PO TBDP: 4.0000 mg | ORAL_TABLET | Freq: Three times a day (TID) | ORAL | 0 refills | Status: DC | PRN

## 2019-07-17 NOTE — ED Triage Notes (Signed)
Donated plasma last pm  Got hot , nausea, vomited  This pm started having n/v feeling hot,  Last time vomited around 1000 this am  denies pain

## 2019-07-17 NOTE — Discharge Instructions (Signed)
You were seen in the emergency department for nausea and vomiting.  We are sending you home from [facility] to take as needed for nausea and vomiting every 8 hours.  We have prescribed you new medication(s) today. Discuss the medications prescribed today with your pharmacist as they can have adverse effects and interactions with your other medicines including over the counter and prescribed medications. Seek medical evaluation if you start to experience new or abnormal symptoms after taking one of these medicines, seek care immediately if you start to experience difficulty breathing, feeling of your throat closing, facial swelling, or rash as these could be indications of a more serious allergic reaction  Your labs were overall reassuring.  Your protein was slightly low, this can be rechecked by primary care.  Your EKG showed findings consistent with Wolff-Parkinson-White, we have given you information for cardiology follow-up.  Return to the ER for new or worsening symptoms including but not limited to fever, inability to keep fluids down, blood in vomit or stool, passing out, chest pain, abdominal pain, or any other concerns

## 2019-07-17 NOTE — ED Notes (Signed)
Fluid challenge successful  

## 2019-07-17 NOTE — ED Provider Notes (Addendum)
[facility] EMERGENCY DEPARTMENT Provider Note   CSN: [id number] Arrival date & time: 07/17/19  [id number]     History   Chief Complaint Chief Complaint  Patient presents with  . Nausea    HPI Jordan Lamb is a 33 y.o. male with history of bipolar 2 disorder & WPW who presents to the emergency department with complaints of N/V that began last night. Patient states he came home from donating plasma which he does twice per week and shortly after began to feel overheated, nauseous, & a bit lightheaded like he might pass out. He states he then had about 3-4 episodes of emesis and went to bed. Woke up this AM feeling okay but then again started vomiting an additional 5-6 times. He was sent home from work secondary to vomiting. He went home and took a nap woke up and was able to tolerate PO by eating a sandwich. He states he ate/drank plenty prior to donating the plasma as he typically does.  He currently is fairly asymptomatic.  He denies any current nausea.  Last BM was prior to nap and was normal. Denies fever, chills, chest pain, dyspnea, abdominal pain, diarrhea, melena, hematochezia, or syncope.  Denies recent foreign travel or antibiotics.  Denies suspicious p.o. intake.  He states he is primarily here for a work note.     HPI  Past Medical History:  Diagnosis Date  . Bipolar 2 disorder (HCC)   . Superficial thrombophlebitis     There are no active problems to display for this patient.   Past Surgical History:  Procedure Laterality Date  . TONSILLECTOMY          Home Medications    Prior to Admission medications   Medication Sig Start Date End Date Taking? Authorizing Provider  aspirin EC 81 MG tablet Take 81 mg by mouth daily.      [provider]  doxycycline (VIBRAMYCIN) 100 MG capsule Take 1 capsule (100 mg total) by mouth 2 (two) times daily. 01/26/19   Dione BoozeGlick, David, MD  fish oil-omega-3 fatty acids 1000 MG capsule Take 2 g by mouth daily.       [provider]  ibuprofen (ADVIL,MOTRIN) 800 MG tablet Take 1 tablet (800 mg total) by mouth every 8 (eight) hours as needed for fever or moderate pain. 11/14/17   Fayrene Helperran, Bowie, PA-C  lamoTRIgine (LAMICTAL) 100 MG tablet Take 100 mg by mouth daily.      [provider]  Multiple Vitamins-Minerals (MULTIVITAMIN WITH MINERALS) tablet Take 1 tablet by mouth daily.      [provider]    Family History No family history on file.  Social History Social History   Tobacco Use  . Smoking status: Former Smoker    Packs/day: 1.00    Types: Cigarettes  . Smokeless tobacco: Never Used  Substance Use Topics  . Alcohol use: Not Currently  . Drug use: Not Currently    Types: Marijuana     Allergies   Patient has no known allergies.   Review of Systems Review of Systems  Constitutional: Negative for chills and fever.  Respiratory: Negative for shortness of breath.   Cardiovascular: Negative for chest pain.  Gastrointestinal: Positive for nausea and vomiting. Negative for abdominal pain, anal bleeding, blood in stool, constipation and diarrhea.  Genitourinary: Negative for dysuria.  Neurological: Positive for light-headedness (resolved @ present). Negative for syncope.  All other systems reviewed and are negative.    Physical Exam Updated Vital  Signs BP 122/75 (BP Location: Left Arm)   Pulse 93   Temp 98.2 F (36.8 C) (Oral)   Resp 16   Ht 6\' 3"  (1.905 m)   Wt 78.9 kg   SpO2 95%   BMI 21.75 kg/m   Physical Exam Vitals signs and nursing note reviewed.  Constitutional:      General: He is not in acute distress.    Appearance: He is well-developed. He is not toxic-appearing.  HENT:     Head: Normocephalic and atraumatic.  Eyes:     General:        Right eye: No discharge.        Left eye: No discharge.     Extraocular Movements: Extraocular movements intact.     Conjunctiva/sclera: Conjunctivae normal.     Pupils: Pupils are equal, round, and  reactive to light.  Neck:     Musculoskeletal: Neck supple.  Cardiovascular:     Rate and Rhythm: Normal rate and regular rhythm.  Pulmonary:     Effort: Pulmonary effort is normal. No respiratory distress.     Breath sounds: Normal breath sounds. No wheezing, rhonchi or rales.  Abdominal:     General: There is no distension.     Palpations: Abdomen is soft.     Tenderness: There is no abdominal tenderness. There is no guarding or rebound.  Skin:    General: Skin is warm and dry.     Findings: No rash.  Neurological:     General: No focal deficit present.     Mental Status: He is alert.     Comments: Clear speech.   Psychiatric:        Behavior: Behavior normal.      ED Treatments / Results  Labs (all labs ordered are listed, but only abnormal results are displayed) Labs Reviewed  COMPREHENSIVE METABOLIC PANEL - Abnormal; Notable for the following components:      Result Value   Total Protein 6.3 (*)    All other components within normal limits  CBC WITH DIFFERENTIAL/PLATELET - Abnormal; Notable for the following components:   WBC 11.4 (*)    All other components within normal limits  LIPASE, BLOOD    EKG EKG Interpretation  Date/Time:  Friday July 17 2019 20:07:06 EDT Ventricular Rate:  66 PR Interval:    QRS Duration: 135 QT Interval:  423 QTC Calculation: 444 R Axis:   -19 Text Interpretation:  Sinus rhythm Ventricular preexcitation(WPW) No change from prior. No STEMI  Confirmed by 10-26-1973 [phone]) on 07/17/2019 8:30:40 PM   Radiology No results found.  Procedures Procedures (including critical care time)  Medications Ordered in ED Medications  sodium chloride 0.9 % bolus 1,000 mL ( Intravenous Stopped 07/17/19 2113)  ondansetron (ZOFRAN) injection 4 mg (4 mg Intravenous Given 07/17/19 2111)     Initial Impression / Assessment and Plan / ED Course  I have reviewed the triage vital signs and the nursing notes.  Pertinent labs & imaging results  that were available during my care of the patient were reviewed by me and considered in my medical decision making (see chart for details).   Patient presents to the ED for evaluation of N/V since last night as well as an episode of feeling overheated/lightheaded as if he may pass out without syncope.  Patient is nontoxic-appearing, no apparent distress, vitals WNL.  Benign physical exam.  No focal neurologic deficits.  Abdomen is soft and nontender without peritoneal signs.  Will check labs  and EKG with plan to administer fluids and p.o. challenge.  CBC: Leukocytosis at 11.4 is felt to be nonspecific.  No anemia. CMP: No electrolyte derangement.  Renal function and LFTs WNL.  Mildly low protein at 6.3. 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  Tolerating p.o. without difficulty.  Feeling well.  Lightheadedness/near syncope likely vagal. Requesting to be discharged.  Will DC home with Zofran to take as needed.  Given information for cardiology follow-up for his WPW which he is aware of.  Recommend PCP follow-up. I discussed results, treatment plan, need for follow-up, and return precautions with the patient. Provided opportunity for questions, patient confirmed understanding and is in agreement with plan.   Findings and plan of care discussed with supervising physician Dr. Laverta Baltimore who is in agreement.   Final Clinical Impressions(s) / ED Diagnoses   Final diagnoses:  Non-intractable vomiting with nausea, unspecified vomiting type    ED Discharge Orders         Ordered    ondansetron (ZOFRAN ODT) 4 MG disintegrating tablet  Every 8 hours PRN     07/17/19 2115           Amaryllis Dyke, PA-C 07/17/19 2119    Amaryllis Dyke, PA-C 07/17/19 2119     Margette Fast, MD 07/19/19 1153

## 2019-08-17 ENCOUNTER — Other Ambulatory Visit: Payer: Self-pay

## 2019-08-17 ENCOUNTER — Emergency Department (HOSPITAL_BASED_OUTPATIENT_CLINIC_OR_DEPARTMENT_OTHER): Admission: EM | Admit: 2019-08-17 | Discharge: 2019-08-17 | Discharge status: Against Medical Advice | Payer: Self-pay

## 2019-08-17 NOTE — ED Notes (Signed)
Pt was starting to exit ED WR when I called his name-in triage he stated he was leaving "to go somewhere else where I can be seen faster"-advised that I was going to do triage check in and we do have a wait-states he is leaving-pt NAD-fast pasted steady gait

## 2020-01-17 ENCOUNTER — Emergency Department (HOSPITAL_COMMUNITY): Payer: Self-pay

## 2020-01-17 ENCOUNTER — Other Ambulatory Visit: Payer: Self-pay

## 2020-01-17 ENCOUNTER — Encounter (HOSPITAL_COMMUNITY): Payer: Self-pay | Admitting: Emergency Medicine

## 2020-01-17 ENCOUNTER — Emergency Department (HOSPITAL_COMMUNITY)
Admission: EM | Admit: 2020-01-17 | Discharge: 2020-01-17 | Disposition: A | Discharge status: Home / Self Care | Payer: Self-pay

## 2020-01-17 ENCOUNTER — Emergency Department (HOSPITAL_COMMUNITY)
Admission: EM | Admit: 2020-01-17 | Discharge: 2020-01-18 | Disposition: A | Discharge status: Home / Self Care | Payer: Self-pay | Attending: Emergency Medicine | Admitting: Emergency Medicine

## 2020-01-17 DIAGNOSIS — F17210 Nicotine dependence, cigarettes, uncomplicated: Secondary | ICD-10-CM | POA: Insufficient documentation

## 2020-01-17 DIAGNOSIS — Z79899 Other long term (current) drug therapy: Secondary | ICD-10-CM | POA: Insufficient documentation

## 2020-01-17 DIAGNOSIS — Z7982 Long term (current) use of aspirin: Secondary | ICD-10-CM | POA: Insufficient documentation

## 2020-01-17 DIAGNOSIS — T50901A Poisoning by unspecified drugs, medicaments and biological substances, accidental (unintentional), initial encounter: Secondary | ICD-10-CM

## 2020-01-17 DIAGNOSIS — T405X1A Poisoning by cocaine, accidental (unintentional), initial encounter: Secondary | ICD-10-CM | POA: Insufficient documentation

## 2020-01-17 LAB — CBC
HCT: 33.8 % — ABNORMAL LOW (ref 39.0–52.0)
Hemoglobin: 10.7 g/dL — ABNORMAL LOW (ref 13.0–17.0)
MCH: 28.8 pg (ref 26.0–34.0)
MCHC: 31.7 g/dL (ref 30.0–36.0)
MCV: 91.1 fL (ref 80.0–100.0)
Platelets: 220 10*3/uL (ref 150–400)
RBC: 3.71 MIL/uL — ABNORMAL LOW (ref 4.22–5.81)
RDW: 14.3 % (ref 11.5–15.5)
WBC: 5.6 10*3/uL (ref 4.0–10.5)
nRBC: 0 % (ref 0.0–0.2)

## 2020-01-17 NOTE — ED Triage Notes (Signed)
Pt arrived via EMS. EMS reports that pt was found by his fiance, semi-responsive with decreased respirations. Pt admits to taking cocaine. Pt's pupils have been restricted, and pt is lethargic. No narcan given by EMS. EMS states that the EKG was normal sinus with [facility] White Syndrome; pt has recent diagnosis of Wolfe Parkinson White Syndrome.

## 2020-01-17 NOTE — ED Notes (Signed)
Pt given urinal and informed that we need a urine sample. Pt verbalized understanding.

## 2020-01-17 NOTE — ED Provider Notes (Signed)
[facility]-EMERGENCY DEPT Provider Note   CSN: [id number] Arrival date & time: 01/17/20  2304     History Chief Complaint  Patient presents with  . Drug Overdose    Jordan Lamb is a 34 y.o. male.  Patient brought to the emergency department by EMS from home with concern over possible overdose.  Patient's fianc found him unresponsive and called 911.  EMS report that he was semiresponsive upon their arrival with decreased respiratory rate.  He only admitted to taking cocaine tonight.  Upon arrival to the ER he is very somnolent, does awaken to voice but easily falls back asleep without finishing answering any questions. Level V Caveat due to mental status change.        Past Medical History:  Diagnosis Date  . Bipolar 2 disorder (HCC)   . Superficial thrombophlebitis     There are no problems to display for this patient.   Past Surgical History:  Procedure Laterality Date  . TONSILLECTOMY         History reviewed. No pertinent family history.  Social History   Tobacco Use  . Smoking status: Current Every Day Smoker    Packs/day: 1.00    Types: Cigarettes  . Smokeless tobacco: Never Used  Substance Use Topics  . Alcohol use: Not Currently  . Drug use: Yes    Types: Marijuana, Cocaine    Home Medications Prior to Admission medications   Medication Sig Start Date End Date Taking? Authorizing Provider  aspirin EC 81 MG tablet Take 81 mg by mouth daily.      [provider]  doxycycline (VIBRAMYCIN) 100 MG capsule Take 1 capsule (100 mg total) by mouth 2 (two) times daily. 01/26/19   Dione Booze, MD  fish oil-omega-3 fatty acids 1000 MG capsule Take 2 g by mouth daily.      [provider]  ibuprofen (ADVIL,MOTRIN) 800 MG tablet Take 1 tablet (800 mg total) by mouth every 8 (eight) hours as needed for fever or moderate pain. 11/14/17   Fayrene Helper, PA-C  lamoTRIgine (LAMICTAL) 100 MG tablet Take 100 mg by mouth daily.       [provider]  Multiple Vitamins-Minerals (MULTIVITAMIN WITH MINERALS) tablet Take 1 tablet by mouth daily.      [provider]  ondansetron (ZOFRAN ODT) 4 MG disintegrating tablet Take 1 tablet (4 mg total) by mouth every 8 (eight) hours as needed for nausea or vomiting. 07/17/19   Petrucelli, Pleas Koch, PA-C    Allergies    Patient has no known allergies.  Review of Systems   Review of Systems  Unable to perform ROS: Mental status change    Physical Exam Updated Vital Signs BP (!) 116/59   Pulse 84   Temp (!) 97.5 F (36.4 C) (Oral)   Resp 14   Ht 6\' 3"  (1.905 m)   SpO2 100%   BMI 21.75 kg/m   Physical Exam Vitals and nursing note reviewed.  Constitutional:      General: He is not in acute distress.    Appearance: He is well-developed.  HENT:     Head: Normocephalic and atraumatic.     Right Ear: Hearing normal.     Left Ear: Hearing normal.     Nose: Nose normal.  Eyes:     Conjunctiva/sclera: Conjunctivae normal.     Pupils: Pupils are equal, round, and reactive to light.  Cardiovascular:     Rate and Rhythm: Regular rhythm.  Heart sounds: S1 normal and S2 normal. No murmur. No friction rub. No gallop.   Pulmonary:     Effort: Pulmonary effort is normal. No respiratory distress.     Breath sounds: Normal breath sounds.  Chest:     Chest wall: No tenderness.  Abdominal:     General: Bowel sounds are normal.     Palpations: Abdomen is soft.     Tenderness: There is no abdominal tenderness. There is no guarding or rebound. Negative signs include Murphy's sign and McBurney's sign.     Hernia: No hernia is present.  Musculoskeletal:        General: Normal range of motion.     Cervical back: Normal range of motion and neck supple.  Skin:    General: Skin is warm and dry.     Findings: No rash.  Neurological:     GCS: GCS eye subscore is 3. GCS verbal subscore is 4. GCS motor subscore is 6.     Cranial Nerves: No cranial nerve deficit.      Sensory: No sensory deficit.     Coordination: Coordination normal.     Comments: Somnolent, awakens to voice  Psychiatric:        Speech: Speech normal.        Behavior: Behavior normal.        Thought Content: Thought content normal.     ED Results / Procedures / Treatments   Labs (all labs ordered are listed, but only abnormal results are displayed) Labs Reviewed  CBC - Abnormal; Notable for the following components:      Result Value   RBC 3.71 (*)    Hemoglobin 10.7 (*)    HCT 33.8 (*)    All other components within normal limits  BASIC METABOLIC PANEL - Abnormal; Notable for the following components:   Chloride 112 (*)    Calcium 8.1 (*)    All other components within normal limits  ETHANOL  RAPID URINE DRUG SCREEN, HOSP PERFORMED    EKG EKG Interpretation  Date/Time:  Sunday January 17 2020 23:41:16 EDT Ventricular Rate:  67 PR Interval:    QRS Duration: 158 QT Interval:  469 QTC Calculation: 496 R Axis:   -42 Text Interpretation: Sinus rhythm Vent pre-excitat'n(WPW), left acces'y pathway Confirmed by Gilda Crease [phone]) on 01/18/2020 1:00:02 AM   Radiology CT HEAD WO CONTRAST  Result Date: 01/18/2020 CLINICAL DATA:  Altered mental status EXAM: CT HEAD WITHOUT CONTRAST TECHNIQUE: Contiguous axial images were obtained from the base of the skull through the vertex without intravenous contrast. COMPARISON:  None. FINDINGS: Brain: No acute territorial infarction, hemorrhage or intracranial mass. The ventricles are nonenlarged. Vascular: No hyperdense vessel or unexpected calcification. Skull: Normal. Negative for fracture or focal lesion. Sinuses/Orbits: No acute finding. Other: None IMPRESSION: Negative non contrasted CT appearance of the brain Electronically Signed   By: Jasmine Pang M.D.   On: 01/18/2020 00:04    Procedures Procedures (including critical care time)  Medications Ordered in ED Medications - No data to display  ED Course  I have  reviewed the triage vital signs and the nursing notes.  Pertinent labs & imaging results that were available during my care of the patient were reviewed by me and considered in my medical decision making (see chart for details).    MDM Rules/Calculators/A&P                      Patient presents to the emergency  department for evaluation of overdose.  At arrival patient was mildly obtunded, but he has significantly improved and is now awake and ambulatory, conversing with staff.  Patient's wife is present in the room.  He wishes to be discharged.  He was not trying to harm himself and therefore does not require further work-up.  Final Clinical Impression(s) / ED Diagnoses Final diagnoses:  Accidental drug overdose, initial encounter    Rx / DC Orders ED Discharge Orders    None       Arbor Cohen, Gwenyth Allegra, MD 01/18/20 [phone]

## 2020-01-18 LAB — BASIC METABOLIC PANEL
Anion gap: 5 (ref 5–15)
BUN: 14 mg/dL (ref 6–20)
CO2: 25 mmol/L (ref 22–32)
Calcium: 8.1 mg/dL — ABNORMAL LOW (ref 8.9–10.3)
Chloride: 112 mmol/L — ABNORMAL HIGH (ref 98–111)
Creatinine, Ser: 0.74 mg/dL (ref 0.61–1.24)
GFR calc Af Amer: >60 mL/min (ref >60)
GFR calc non Af Amer: >60 mL/min (ref >60)
Glucose, Bld: 74 mg/dL (ref 70–99)
Potassium: 4 mmol/L (ref 3.5–5.1)
Sodium: 142 mmol/L (ref 135–145)

## 2020-01-18 LAB — RAPID URINE DRUG SCREEN, HOSP PERFORMED
Amphetamines: POSITIVE — AB
Barbiturates: NOT DETECTED
Benzodiazepines: POSITIVE — AB
Cocaine: NOT DETECTED
Opiates: POSITIVE — AB
Tetrahydrocannabinol: POSITIVE — AB

## 2020-01-18 LAB — ETHANOL: Alcohol, Ethyl (B): <10 mg/dL (ref <10)

## 2020-01-19 ENCOUNTER — Emergency Department (HOSPITAL_COMMUNITY)
Admission: EM | Admit: 2020-01-19 | Discharge: 2020-01-19 | Disposition: A | Discharge status: Home / Self Care | Payer: Self-pay | Attending: Emergency Medicine | Admitting: Emergency Medicine

## 2020-01-19 ENCOUNTER — Encounter (HOSPITAL_COMMUNITY): Payer: Self-pay | Admitting: Emergency Medicine

## 2020-01-19 DIAGNOSIS — Z5321 Procedure and treatment not carried out due to patient leaving prior to being seen by health care provider: Secondary | ICD-10-CM | POA: Insufficient documentation

## 2020-01-19 DIAGNOSIS — R0789 Other chest pain: Secondary | ICD-10-CM | POA: Insufficient documentation

## 2020-01-19 NOTE — ED Notes (Signed)
Pt.left  AFTER BEING SEEN IN TRIAGE
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
## 2020-04-18 ENCOUNTER — Emergency Department (HOSPITAL_BASED_OUTPATIENT_CLINIC_OR_DEPARTMENT_OTHER)
Admission: EM | Admit: 2020-04-18 | Discharge: 2020-04-18 | Disposition: A | Discharge status: Home / Self Care | Payer: Self-pay | Attending: Emergency Medicine | Admitting: Emergency Medicine

## 2020-04-18 ENCOUNTER — Other Ambulatory Visit: Payer: Self-pay

## 2020-04-18 ENCOUNTER — Encounter (HOSPITAL_BASED_OUTPATIENT_CLINIC_OR_DEPARTMENT_OTHER): Payer: Self-pay | Admitting: *Deleted

## 2020-04-18 DIAGNOSIS — R110 Nausea: Secondary | ICD-10-CM | POA: Insufficient documentation

## 2020-04-18 DIAGNOSIS — F17210 Nicotine dependence, cigarettes, uncomplicated: Secondary | ICD-10-CM | POA: Insufficient documentation

## 2020-04-18 DIAGNOSIS — Z7982 Long term (current) use of aspirin: Secondary | ICD-10-CM | POA: Insufficient documentation

## 2020-04-18 DIAGNOSIS — Z79899 Other long term (current) drug therapy: Secondary | ICD-10-CM | POA: Insufficient documentation

## 2020-04-18 NOTE — ED Triage Notes (Signed)
Abdominal pain x 3 days. States he needs a note saying he does not have Covid. He had a rapid test at [facility] today and is waiting for results. Here requesting rapid test that will result faster that the [facility] for work.

## 2020-04-18 NOTE — Discharge Instructions (Signed)
You were seen here for nausea.  Physical exam and vital signs are reassuring.  Your Covid test is pending I need you to self quarantine until your test become available.  If you are Covid positive you need to self quarantine for 14 days and then be reassessed by a medical provider to go back to work.  I have given you contact number for [facility] and wellness they work with individuals with little to no insurance and they can help you find a primary care provider.  Please contact them at your earliest convenience.  If you develop shortness of breath, chest pain, uncontrolled nausea, vomiting, diarrhea, fever I need you to come back to the emergency room to be reevaluated.

## 2020-04-18 NOTE — ED Provider Notes (Signed)
[facility] EMERGENCY DEPARTMENT Provider Note   CSN: [id number] Arrival date & time: 04/18/20  1337     History Chief Complaint  Patient presents with  . Abdominal Pain    Jordan Lamb is a 34 y.o. male.  HPI   Patient presents emergency department with chief complaint of nausea that was 3 days ago and a work note.  Patient explains Friday felt nauseated and did not going to work, since then he has felt better but went to [facility] for a Covid test, results are still pending.  Patient explains he needs a work note so that he can go back into work.  Patient states he is in no discomfort or pain, denies any nausea or vomiting,headache, fever, chills, congestion, sore throat, cough, chest pain, shortness of breath, abdominal pain, nausea, vomiting, dysuria, diarrhea, pedal edema, tolerating p.o. without difficulty.  He does not want any testing just wants a note saying that if his Covid test is negative he can go back to work.    Past Medical History:  Diagnosis Date  . Bipolar 2 disorder (HCC)   . Superficial thrombophlebitis     There are no problems to display for this patient.   Past Surgical History:  Procedure Laterality Date  . TONSILLECTOMY         No family history on file.  Social History   Tobacco Use  . Smoking status: Current Every Day Smoker    Packs/day: 1.00    Types: Cigarettes  . Smokeless tobacco: Never Used  Vaping Use  . Vaping Use: Every day  Substance Use Topics  . Alcohol use: Not Currently  . Drug use: Yes    Types: Marijuana, Cocaine    Home Medications Prior to Admission medications   Medication Sig Start Date End Date Taking? Authorizing Provider  aspirin EC 81 MG tablet Take 81 mg by mouth daily.      [provider]  doxycycline (VIBRAMYCIN) 100 MG capsule Take 1 capsule (100 mg total) by mouth 2 (two) times daily. 01/26/19   Dione Booze, MD  fish oil-omega-3 fatty acids 1000 MG capsule Take 2 g by mouth  daily.      [provider]  ibuprofen (ADVIL,MOTRIN) 800 MG tablet Take 1 tablet (800 mg total) by mouth every 8 (eight) hours as needed for fever or moderate pain. 11/14/17   Fayrene Helper, PA-C  lamoTRIgine (LAMICTAL) 100 MG tablet Take 100 mg by mouth daily.      [provider]  Multiple Vitamins-Minerals (MULTIVITAMIN WITH MINERALS) tablet Take 1 tablet by mouth daily.      [provider]  ondansetron (ZOFRAN ODT) 4 MG disintegrating tablet Take 1 tablet (4 mg total) by mouth every 8 (eight) hours as needed for nausea or vomiting. 07/17/19   Petrucelli, Pleas Koch, PA-C    Allergies    Patient has no known allergies.  Review of Systems   Review of Systems  Constitutional: Negative for chills and fever.  HENT: Negative for congestion, sore throat and tinnitus.   Respiratory: Negative for shortness of breath.   Cardiovascular: Negative for chest pain.  Gastrointestinal: Negative for abdominal pain.  Genitourinary: Negative for dysuria, enuresis and flank pain.  Musculoskeletal: Negative for back pain and myalgias.  Skin: Negative for rash.  Neurological: Negative for dizziness and headaches.  Hematological: Does not bruise/bleed easily.    Physical Exam Updated Vital Signs BP 119/80   Pulse 92   Temp 98.2 F (36.8 C) (  Oral)   Resp 14   Ht 6\' 3"  (1.905 m)   Wt 68 kg   SpO2 100%   BMI 18.75 kg/m   Physical Exam Vitals and nursing note reviewed.  Constitutional:      General: He is not in acute distress.    Appearance: He is not ill-appearing.  HENT:     Head: Normocephalic and atraumatic.     Nose: No congestion.     Mouth/Throat:     Mouth: Mucous membranes are moist.     Pharynx: Oropharynx is clear. No oropharyngeal exudate or posterior oropharyngeal erythema.  Eyes:     General: No scleral icterus. Cardiovascular:     Rate and Rhythm: Normal rate and regular rhythm.     Pulses: Normal pulses.     Heart sounds: No murmur heard.  No  friction rub. No gallop.   Pulmonary:     Effort: No respiratory distress.     Breath sounds: No wheezing, rhonchi or rales.  Abdominal:     General: Abdomen is flat. Bowel sounds are normal. There is no distension.     Palpations: Abdomen is soft. There is no mass.     Tenderness: There is no abdominal tenderness. There is no right CVA tenderness, left CVA tenderness, guarding or rebound.     Hernia: No hernia is present.  Musculoskeletal:        General: No swelling.  Skin:    General: Skin is warm and dry.     Capillary Refill: Capillary refill takes less than 2 seconds.     Findings: No rash.  Neurological:     Mental Status: He is alert and oriented to person, place, and time.  Psychiatric:        Mood and Affect: Mood normal.     ED Results / Procedures / Treatments   Labs (all labs ordered are listed, but only abnormal results are displayed) Labs Reviewed - No data to display  EKG None  Radiology No results found.  Procedures Procedures (including critical care time)  Medications Ordered in ED Medications - No data to display  ED Course  I have reviewed the triage vital signs and the nursing notes.  Pertinent labs & imaging results that were available during my care of the patient were reviewed by me and considered in my medical decision making (see chart for details).    MDM Rules/Calculators/A&P                          I have personally reviewed all imaging, labs and have interpreted them.  Unlikely patient suffering from pneumonia, denies cough, congestion, he is nontoxic-appearing, vital signs reassuring, lung sounds were clear bilaterally, no rales, rhonchi's, wheezing heard.  Unlikely patient suffering from strep throat, denies sore throat, postnasal drip, physical exam did not show erythema or exudates, tonsils, uvula midline, tongue midline.  Unlikely patient suffering from pancreatitis, gastroenteritis, obstruction, pyelonephritis, kidney stones as  he denies nausea, vomiting, abdominal pain, flank pain, tolerating p.o. physical exam showed normal abdomen no acute abdomen noted.  Patient is nontoxic appearing, vital signs reassuring, physical exam did not show any significant findings.  Patient has no complaints at this time, stating he is feeling much better denies  fevers or chills, nausea or vomiting, abdominal pain, cough, congestion additional testing and imaging were not indicated.   Patient appears to be resting comfortably in bed, showing no acute signs distress.  Vital signs have  remained stable does not meet criteria to be admitted to the hospital.  Likely patient suffered a viral infection. He was tested for Covid recommend that he self quarantine until his results are available.  If he is Covid positive he must quarantine for 14 days and be reassessed by a healthcare professional.  Patient was discussed with attending who agrees assessment and plan.  Patient was given at home schedule strict return precautions.  Patient verbalizes any understood and agrees to plan. Final Clinical Impression(s) / ED Diagnoses Final diagnoses:  Nausea    Rx / DC Orders ED Discharge Orders    None       Carroll Sage, PA-C 04/18/20 1517    Melene Plan, DO 04/19/20 1504

## 2020-05-23 ENCOUNTER — Encounter (HOSPITAL_BASED_OUTPATIENT_CLINIC_OR_DEPARTMENT_OTHER): Payer: Self-pay

## 2020-05-23 ENCOUNTER — Emergency Department (HOSPITAL_BASED_OUTPATIENT_CLINIC_OR_DEPARTMENT_OTHER): Payer: Self-pay

## 2020-05-23 ENCOUNTER — Emergency Department (HOSPITAL_BASED_OUTPATIENT_CLINIC_OR_DEPARTMENT_OTHER)
Admission: EM | Admit: 2020-05-23 | Discharge: 2020-05-23 | Disposition: A | Discharge status: Home / Self Care | Payer: Self-pay | Attending: Emergency Medicine | Admitting: Emergency Medicine

## 2020-05-23 ENCOUNTER — Other Ambulatory Visit: Payer: Self-pay

## 2020-05-23 DIAGNOSIS — Y9389 Activity, other specified: Secondary | ICD-10-CM | POA: Insufficient documentation

## 2020-05-23 DIAGNOSIS — F17210 Nicotine dependence, cigarettes, uncomplicated: Secondary | ICD-10-CM | POA: Insufficient documentation

## 2020-05-23 DIAGNOSIS — Y999 Unspecified external cause status: Secondary | ICD-10-CM | POA: Insufficient documentation

## 2020-05-23 DIAGNOSIS — H9202 Otalgia, left ear: Secondary | ICD-10-CM | POA: Insufficient documentation

## 2020-05-23 DIAGNOSIS — Y929 Unspecified place or not applicable: Secondary | ICD-10-CM | POA: Insufficient documentation

## 2020-05-23 DIAGNOSIS — S0993XA Unspecified injury of face, initial encounter: Secondary | ICD-10-CM | POA: Insufficient documentation

## 2020-05-23 DIAGNOSIS — W228XXA Striking against or struck by other objects, initial encounter: Secondary | ICD-10-CM | POA: Insufficient documentation

## 2020-05-23 MED ORDER — AMOXICILLIN 500 MG PO CAPS: 500.0000 mg | ORAL_CAPSULE | Freq: Three times a day (TID) | ORAL | 0 refills | Status: AC

## 2020-05-23 NOTE — ED Provider Notes (Signed)
[facility]GENCY [facility]PT [facility] Emergency Department Provider Note MRN:  [id number]  Arrival date & time: 05/23/20     Chief Complaint   Otalgia   History of Present Illness   Jordan Lamb is a 34 y.o. year-old male with a history of bipolar disorder presenting to the ED with chief complaint of otalgia.  Patient sustained trauma to his jaw 1 week ago and has continued pain to the left jaw and left ear.  Was struck by his dog's head.  Large dog that jumped up in the dog's head hit him in the jaw, causing the jaw to move laterally to the left.  Endorses aligned bite, no tooth pain or injury, no loss of consciousness, no nausea or vomiting.  No hearing loss.  Pain is constant, mild to moderate, worse with motion or palpation of the left ear area.  Review of Systems  A complete 10 system review of systems was obtained and all systems are negative except as noted in the HPI and PMH.   Patient's Health History    Past Medical History:  Diagnosis Date  . Bipolar 2 disorder (HCC)   . Superficial thrombophlebitis     Past Surgical History:  Procedure Laterality Date  . TONSILLECTOMY      No family history on file.  Social History   Socioeconomic History  . Marital status: Married    Spouse name: Not on file  . Number of children: Not on file  . Years of education: Not on file  . Highest education level: Not on file  Occupational History  . Not on file  Tobacco Use  . Smoking status: Current Every Day Smoker    Packs/day: 0.50    Types: Cigarettes  . Smokeless tobacco: Never Used  Vaping Use  . Vaping Use: Every day  Substance and Sexual Activity  . Alcohol use: Not Currently  . Drug use: Not Currently    Types: Marijuana, Cocaine  . Sexual activity: Not on file  Other Topics Concern  . Not on file  Social History Narrative  . Not on file   Social Determinants of Health   Financial Resource Strain:   . Difficulty of Paying Living Expenses:   Food  Insecurity:   . Worried About Programme researcher, broadcasting/film/video in the Last Year:   . Barista in the Last Year:   Transportation Needs:   . Freight forwarder (Medical):   Marland Kitchen Lack of Transportation (Non-Medical):   Physical Activity:   . Days of Exercise per Week:   . Minutes of Exercise per Session:   Stress:   . Feeling of Stress :   Social Connections:   . Frequency of Communication with Friends and Family:   . Frequency of Social Gatherings with Friends and Family:   . Attends Religious Services:   . Active Member of Clubs or Organizations:   . Attends Banker Meetings:   Marland Kitchen Marital Status:   Intimate Partner Violence:   . Fear of Current or Ex-Partner:   . Emotionally Abused:   Marland Kitchen Physically Abused:   . Sexually Abused:      Physical Exam   Vitals:   05/23/20 0802  BP: 136/72  Pulse: 78  Resp: 18  Temp: 98.5 F (36.9 C)  SpO2: 99%    CONSTITUTIONAL: Well-appearing, NAD NEURO:  Alert and oriented x 3, no focal deficits EYES:  eyes equal and reactive ENT/NECK:  no LAD, no JVD; left hemotympanum,  moderate tenderness palpation to the insertion site of the left mandible CARDIO: Regular rate, well-perfused, normal S1 and S2 PULM:  CTAB no wheezing or rhonchi GI/GU:  normal bowel sounds, non-distended, non-tender MSK/SPINE:  No gross deformities, no edema SKIN:  no rash, atraumatic PSYCH:  Appropriate speech and behavior  *Additional and/or pertinent findings included in MDM below  Diagnostic and Interventional Summary    EKG Interpretation  Date/Time:    Ventricular Rate:    PR Interval:    QRS Duration:   QT Interval:    QTC Calculation:   R Axis:     Text Interpretation:        Labs Reviewed - No data to display  CT Maxillofacial Wo Contrast  Final Result    CT Head Wo Contrast  Final Result      Medications - No data to display   Procedures  /  Critical Care Procedures  ED Course and Medical Decision Making  I have reviewed the  triage vital signs, the nursing notes, and pertinent available records from the EMR.  Listed above are laboratory and imaging tests that I personally ordered, reviewed, and interpreted and then considered in my medical decision making (see below for details).  Trauma with hemotympanum, fairly significant tenderness to palpation to the mandible at the insertion site near the periauricular area, question mandibular fracture, question basilar skull fracture.  CTs are pending.     CT imaging is reassuring, no skull fracture, no fluid in the canal.  Upon closer evaluation of the eardrum, there is diffuse erythema and infection may be the underlying cause rather than bleeding.  Will cover with amoxicillin, appropriate for discharge.  Elmer Sow. Pilar Plate, MD [facility] Emergency Medicine [facility] [email or web address]  Final Clinical Impressions(s) / ED Diagnoses     ICD-10-CM   1. Left ear pain  H92.02   2. Facial injury, initial encounter  S09.93XA     ED Discharge Orders         Ordered    amoxicillin (AMOXIL) 500 MG capsule  3 times daily     Discontinue     05/23/20 0939           Discharge Instructions Discussed with and Provided to Patient:     Discharge Instructions     You were evaluated in the Emergency Department and after careful evaluation, we did not find any emergent condition requiring admission or further testing in the hospital.  Your exam/testing today was overall reassuring.  Your CT scans did not show any significant traumatic injuries.  Your symptoms may be explained by an ear infection.  Please take the antibiotics as directed.  Use Tylenol or Motrin for pain.  Please return to the Emergency Department if you experience any worsening of your condition.  Thank you for allowing [place] to be a part of your care.        Sabas Sous, MD 05/23/20 [id number]

## 2020-05-23 NOTE — ED Triage Notes (Signed)
Pt arrives with c/o pain to left Ear X1 week, pt also reports about a week ago being head-butted in jaw by large dog.

## 2020-05-23 NOTE — Discharge Instructions (Signed)
You were evaluated in the Emergency Department and after careful evaluation, we did not find any emergent condition requiring admission or further testing in the hospital.  Your exam/testing today was overall reassuring.  Your CT scans did not show any significant traumatic injuries.  Your symptoms may be explained by an ear infection.  Please take the antibiotics as directed.  Use Tylenol or Motrin for pain.  Please return to the Emergency Department if you experience any worsening of your condition.  Thank you for allowing [place] to be a part of your care.

## 2020-06-22 ENCOUNTER — Emergency Department (HOSPITAL_COMMUNITY): Admission: EM | Admit: 2020-06-22 | Discharge: 2020-06-22 | Discharge status: Against Medical Advice | Payer: Self-pay

## 2020-06-22 NOTE — ED Notes (Signed)
Called x1. No answer.
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
## 2020-09-29 ENCOUNTER — Observation Stay (HOSPITAL_COMMUNITY): Payer: No Typology Code available for payment source | Admitting: Certified Registered Nurse Anesthetist

## 2020-09-29 ENCOUNTER — Encounter (HOSPITAL_COMMUNITY)
Admission: EM | Disposition: A | Discharge status: Home / Self Care | Payer: Self-pay | Source: Home / Self Care | Attending: Emergency Medicine

## 2020-09-29 ENCOUNTER — Emergency Department (HOSPITAL_COMMUNITY): Payer: No Typology Code available for payment source

## 2020-09-29 ENCOUNTER — Encounter (HOSPITAL_COMMUNITY): Payer: Self-pay

## 2020-09-29 ENCOUNTER — Observation Stay (HOSPITAL_COMMUNITY)
Admission: EM | Admit: 2020-09-29 | Discharge: 2020-09-30 | Disposition: A | Discharge status: Home / Self Care | Payer: No Typology Code available for payment source | Attending: General Surgery | Admitting: General Surgery

## 2020-09-29 DIAGNOSIS — Y92009 Unspecified place in unspecified non-institutional (private) residence as the place of occurrence of the external cause: Secondary | ICD-10-CM | POA: Diagnosis not present

## 2020-09-29 DIAGNOSIS — F17210 Nicotine dependence, cigarettes, uncomplicated: Secondary | ICD-10-CM | POA: Insufficient documentation

## 2020-09-29 DIAGNOSIS — Z7982 Long term (current) use of aspirin: Secondary | ICD-10-CM | POA: Diagnosis not present

## 2020-09-29 DIAGNOSIS — S31109A Unspecified open wound of abdominal wall, unspecified quadrant without penetration into peritoneal cavity, initial encounter: Principal | ICD-10-CM | POA: Insufficient documentation

## 2020-09-29 DIAGNOSIS — R109 Unspecified abdominal pain: Secondary | ICD-10-CM | POA: Insufficient documentation

## 2020-09-29 DIAGNOSIS — Z20822 Contact with and (suspected) exposure to covid-19: Secondary | ICD-10-CM | POA: Diagnosis not present

## 2020-09-29 DIAGNOSIS — W3400XA Accidental discharge from unspecified firearms or gun, initial encounter: Secondary | ICD-10-CM

## 2020-09-29 DIAGNOSIS — Z23 Encounter for immunization: Secondary | ICD-10-CM | POA: Diagnosis not present

## 2020-09-29 HISTORY — PX: IRRIGATION AND DEBRIDEMENT BUTTOCKS: SHX6601

## 2020-09-29 HISTORY — DX: Pre-excitation syndrome: I45.6

## 2020-09-29 LAB — TYPE AND SCREEN
ABO/RH(D): A POS
Antibody Screen: NEGATIVE

## 2020-09-29 LAB — CBC WITH DIFFERENTIAL/PLATELET
Abs Immature Granulocytes: 0.02 10*3/uL (ref 0.00–0.07)
Basophils Absolute: 0.1 10*3/uL (ref 0.0–0.1)
Basophils Relative: 1 %
Eosinophils Absolute: 0.2 10*3/uL (ref 0.0–0.5)
Eosinophils Relative: 2 %
HCT: 42.6 % (ref 39.0–52.0)
Hemoglobin: 13.8 g/dL (ref 13.0–17.0)
Immature Granulocytes: 0 %
Lymphocytes Relative: 21 %
Lymphs Abs: 2.1 10*3/uL (ref 0.7–4.0)
MCH: 28.9 pg (ref 26.0–34.0)
MCHC: 32.4 g/dL (ref 30.0–36.0)
MCV: 89.1 fL (ref 80.0–100.0)
Monocytes Absolute: 0.9 10*3/uL (ref 0.1–1.0)
Monocytes Relative: 9 %
Neutro Abs: 6.7 10*3/uL (ref 1.7–7.7)
Neutrophils Relative %: 67 %
Platelets: 304 10*3/uL (ref 150–400)
RBC: 4.78 MIL/uL (ref 4.22–5.81)
RDW: 15 % (ref 11.5–15.5)
WBC: 10 10*3/uL (ref 4.0–10.5)
nRBC: 0 % (ref 0.0–0.2)

## 2020-09-29 LAB — COMPREHENSIVE METABOLIC PANEL
ALT: 13 U/L (ref 0–44)
AST: 18 U/L (ref 15–41)
Albumin: 4.5 g/dL (ref 3.5–5.0)
Alkaline Phosphatase: 69 U/L (ref 38–126)
Anion gap: 14 (ref 5–15)
BUN: 10 mg/dL (ref 6–20)
CO2: 19 mmol/L — ABNORMAL LOW (ref 22–32)
Calcium: 9.4 mg/dL (ref 8.9–10.3)
Chloride: 108 mmol/L (ref 98–111)
Creatinine, Ser: 1.1 mg/dL (ref 0.61–1.24)
GFR, Estimated: >60 mL/min (ref >60)
Glucose, Bld: 81 mg/dL (ref 70–99)
Potassium: 3.4 mmol/L — ABNORMAL LOW (ref 3.5–5.1)
Sodium: 141 mmol/L (ref 135–145)
Total Bilirubin: 0.7 mg/dL (ref 0.3–1.2)
Total Protein: 7.6 g/dL (ref 6.5–8.1)

## 2020-09-29 LAB — CBC
HCT: 38.8 % — ABNORMAL LOW (ref 39.0–52.0)
Hemoglobin: 12.6 g/dL — ABNORMAL LOW (ref 13.0–17.0)
MCH: 28.6 pg (ref 26.0–34.0)
MCHC: 32.5 g/dL (ref 30.0–36.0)
MCV: 88 fL (ref 80.0–100.0)
Platelets: 258 10*3/uL (ref 150–400)
RBC: 4.41 MIL/uL (ref 4.22–5.81)
RDW: 14.8 % (ref 11.5–15.5)
WBC: 11.6 10*3/uL — ABNORMAL HIGH (ref 4.0–10.5)
nRBC: 0 % (ref 0.0–0.2)

## 2020-09-29 LAB — RESP PANEL BY RT-PCR (FLU A&B, COVID) ARPGX2
Influenza A by PCR: NEGATIVE
Influenza B by PCR: NEGATIVE
SARS Coronavirus 2 by RT PCR: NEGATIVE

## 2020-09-29 LAB — CREATININE, SERUM
Creatinine, Ser: 0.84 mg/dL (ref 0.61–1.24)
GFR, Estimated: >60 mL/min (ref >60)

## 2020-09-29 LAB — ABO/RH: ABO/RH(D): A POS

## 2020-09-29 LAB — SURGICAL PCR SCREEN
MRSA, PCR: POSITIVE — AB
Staphylococcus aureus: POSITIVE — AB

## 2020-09-29 LAB — PROTIME-INR
INR: 1 (ref 0.8–1.2)
Prothrombin Time: 12.6 seconds (ref 11.4–15.2)

## 2020-09-29 SURGERY — IRRIGATION AND DEBRIDEMENT BUTTOCKS
Anesthesia: General | Laterality: Left

## 2020-09-29 MED ORDER — SUCCINYLCHOLINE CHLORIDE 200 MG/10ML IV SOSY
PREFILLED_SYRINGE | INTRAVENOUS | Status: AC
  Filled 2020-09-29: qty 10

## 2020-09-29 MED ORDER — PROPOFOL 10 MG/ML IV BOLUS
INTRAVENOUS | Status: AC
  Filled 2020-09-29: qty 20

## 2020-09-29 MED ORDER — HYDROMORPHONE HCL 1 MG/ML IJ SOLN
INTRAMUSCULAR | Status: AC
  Filled 2020-09-29: qty 2

## 2020-09-29 MED ORDER — KETAMINE HCL 10 MG/ML IJ SOLN
INTRAMUSCULAR | Status: DC | PRN
  Administered 2020-09-29: 60 mg via INTRAVENOUS

## 2020-09-29 MED ORDER — OXYCODONE HCL 5 MG PO TABS
5.0000 mg | ORAL_TABLET | ORAL | Status: DC | PRN
  Filled 2020-09-29: qty 2

## 2020-09-29 MED ORDER — DEXAMETHASONE SODIUM PHOSPHATE 10 MG/ML IJ SOLN
INTRAMUSCULAR | Status: AC
  Filled 2020-09-29: qty 1

## 2020-09-29 MED ORDER — KETAMINE HCL 10 MG/ML IJ SOLN
INTRAMUSCULAR | Status: AC
  Filled 2020-09-29: qty 1

## 2020-09-29 MED ORDER — LORAZEPAM 2 MG/ML IJ SOLN: 0.5000 mg | Freq: Four times a day (QID) | INTRAMUSCULAR | Status: DC | PRN

## 2020-09-29 MED ORDER — ACETAMINOPHEN 10 MG/ML IV SOLN: 1000.0000 mg | Freq: Once | INTRAVENOUS | Status: DC | PRN

## 2020-09-29 MED ORDER — ONDANSETRON 4 MG PO TBDP: 4.0000 mg | ORAL_TABLET | Freq: Four times a day (QID) | ORAL | Status: DC | PRN

## 2020-09-29 MED ORDER — LACTATED RINGERS IV SOLN: INTRAVENOUS | Status: DC

## 2020-09-29 MED ORDER — FENTANYL CITRATE (PF) 100 MCG/2ML IJ SOLN
50.0000 ug | Freq: Once | INTRAMUSCULAR | Status: AC
  Administered 2020-09-29: 50 ug via INTRAVENOUS
  Filled 2020-09-29: qty 2

## 2020-09-29 MED ORDER — SODIUM CHLORIDE 0.9 % IR SOLN
Status: DC | PRN
  Administered 2020-09-29: 3000 mL

## 2020-09-29 MED ORDER — EPINEPHRINE PF 1 MG/ML IJ SOLN
INTRAMUSCULAR | Status: AC
  Filled 2020-09-29: qty 2

## 2020-09-29 MED ORDER — ACETAMINOPHEN 325 MG PO TABS
650.0000 mg | ORAL_TABLET | Freq: Four times a day (QID) | ORAL | Status: DC
  Administered 2020-09-30: 11:00:00 650 mg via ORAL
  Filled 2020-09-29 (×2): qty 2

## 2020-09-29 MED ORDER — SUGAMMADEX SODIUM 200 MG/2ML IV SOLN
INTRAVENOUS | Status: DC | PRN
  Administered 2020-09-29: 200 mg via INTRAVENOUS

## 2020-09-29 MED ORDER — MIDAZOLAM HCL 5 MG/5ML IJ SOLN
INTRAMUSCULAR | Status: DC | PRN
  Administered 2020-09-29: 2 mg via INTRAVENOUS

## 2020-09-29 MED ORDER — MORPHINE SULFATE (PF) 2 MG/ML IV SOLN: 2.0000 mg | INTRAVENOUS | Status: DC | PRN

## 2020-09-29 MED ORDER — EPHEDRINE 5 MG/ML INJ
INTRAVENOUS | Status: AC
  Filled 2020-09-29: qty 10

## 2020-09-29 MED ORDER — LIDOCAINE HCL (PF) 2 % IJ SOLN
INTRAMUSCULAR | Status: AC
  Filled 2020-09-29: qty 5

## 2020-09-29 MED ORDER — KCL IN DEXTROSE-NACL 20-5-0.45 MEQ/L-%-% IV SOLN
INTRAVENOUS | Status: DC
  Filled 2020-09-29: qty 1000

## 2020-09-29 MED ORDER — PHENYLEPHRINE 40 MCG/ML (10ML) SYRINGE FOR IV PUSH (FOR BLOOD PRESSURE SUPPORT)
PREFILLED_SYRINGE | INTRAVENOUS | Status: AC
  Filled 2020-09-29: qty 20

## 2020-09-29 MED ORDER — BUPIVACAINE-EPINEPHRINE 0.25% -1:200000 IJ SOLN
INTRAMUSCULAR | Status: AC
  Filled 2020-09-29: qty 1

## 2020-09-29 MED ORDER — DEXAMETHASONE SODIUM PHOSPHATE 10 MG/ML IJ SOLN
INTRAMUSCULAR | Status: DC | PRN
  Administered 2020-09-29: 8 mg via INTRAVENOUS

## 2020-09-29 MED ORDER — PROPOFOL 10 MG/ML IV BOLUS
INTRAVENOUS | Status: DC | PRN
  Administered 2020-09-29: 200 mg via INTRAVENOUS

## 2020-09-29 MED ORDER — ENOXAPARIN SODIUM 30 MG/0.3ML ~~LOC~~ SOLN: 30.0000 mg | Freq: Two times a day (BID) | SUBCUTANEOUS | Status: DC

## 2020-09-29 MED ORDER — ROCURONIUM BROMIDE 10 MG/ML (PF) SYRINGE
PREFILLED_SYRINGE | INTRAVENOUS | Status: AC
  Filled 2020-09-29: qty 10

## 2020-09-29 MED ORDER — SUCCINYLCHOLINE CHLORIDE 200 MG/10ML IV SOSY
PREFILLED_SYRINGE | INTRAVENOUS | Status: DC | PRN
  Administered 2020-09-29: 200 mg via INTRAVENOUS

## 2020-09-29 MED ORDER — SODIUM CHLORIDE 0.9 % IV BOLUS
1000.0000 mL | Freq: Once | INTRAVENOUS | Status: AC
  Administered 2020-09-29: 1000 mL via INTRAVENOUS

## 2020-09-29 MED ORDER — 0.9 % SODIUM CHLORIDE (POUR BTL) OPTIME
TOPICAL | Status: DC | PRN
  Administered 2020-09-29: 18:00:00 1000 mL

## 2020-09-29 MED ORDER — BUPIVACAINE-EPINEPHRINE (PF) 0.25% -1:200000 IJ SOLN
INTRAMUSCULAR | Status: DC | PRN
  Administered 2020-09-29: 20 mL

## 2020-09-29 MED ORDER — ROCURONIUM BROMIDE 10 MG/ML (PF) SYRINGE
PREFILLED_SYRINGE | INTRAVENOUS | Status: DC | PRN
  Administered 2020-09-29: 10 mg via INTRAVENOUS
  Administered 2020-09-29: 30 mg via INTRAVENOUS

## 2020-09-29 MED ORDER — CHLORHEXIDINE GLUCONATE 0.12 % MT SOLN
15.0000 mL | Freq: Once | OROMUCOSAL | Status: AC
  Administered 2020-09-29: 17:00:00 15 mL via OROMUCOSAL

## 2020-09-29 MED ORDER — TETANUS-DIPHTH-ACELL PERTUSSIS 5-2.5-18.5 LF-MCG/0.5 IM SUSY
0.5000 mL | PREFILLED_SYRINGE | Freq: Once | INTRAMUSCULAR | Status: AC
  Administered 2020-09-29: 15:00:00 0.5 mL via INTRAMUSCULAR
  Filled 2020-09-29: qty 0.5

## 2020-09-29 MED ORDER — ONDANSETRON HCL 4 MG/2ML IJ SOLN
INTRAMUSCULAR | Status: DC | PRN
  Administered 2020-09-29: 4 mg via INTRAVENOUS

## 2020-09-29 MED ORDER — METOPROLOL TARTRATE 5 MG/5ML IV SOLN: 5.0000 mg | Freq: Four times a day (QID) | INTRAVENOUS | Status: DC | PRN

## 2020-09-29 MED ORDER — CEFAZOLIN SODIUM-DEXTROSE 2-4 GM/100ML-% IV SOLN
2.0000 g | Freq: Once | INTRAVENOUS | Status: AC
  Administered 2020-09-29: 16:00:00 2 g via INTRAVENOUS
  Filled 2020-09-29: qty 100

## 2020-09-29 MED ORDER — PHENYLEPHRINE 40 MCG/ML (10ML) SYRINGE FOR IV PUSH (FOR BLOOD PRESSURE SUPPORT)
PREFILLED_SYRINGE | INTRAVENOUS | Status: DC | PRN
  Administered 2020-09-29: 80 ug via INTRAVENOUS
  Administered 2020-09-29: 120 ug via INTRAVENOUS
  Administered 2020-09-29 (×2): 80 ug via INTRAVENOUS
  Administered 2020-09-29 (×2): 120 ug via INTRAVENOUS

## 2020-09-29 MED ORDER — CEFAZOLIN SODIUM-DEXTROSE 2-4 GM/100ML-% IV SOLN
2.0000 g | Freq: Three times a day (TID) | INTRAVENOUS | Status: DC
Start: 2020-09-29 — End: 2020-09-30
  Administered 2020-09-29: 22:00:00 2 g via INTRAVENOUS
  Filled 2020-09-29 (×2): qty 100

## 2020-09-29 MED ORDER — LIDOCAINE 2% (20 MG/ML) 5 ML SYRINGE
INTRAMUSCULAR | Status: DC | PRN
  Administered 2020-09-29: 70 mg via INTRAVENOUS

## 2020-09-29 MED ORDER — METHOCARBAMOL 500 MG PO TABS
500.0000 mg | ORAL_TABLET | Freq: Three times a day (TID) | ORAL | Status: DC
  Administered 2020-09-30: 11:00:00 500 mg via ORAL
  Filled 2020-09-29 (×2): qty 1

## 2020-09-29 MED ORDER — FENTANYL CITRATE (PF) 250 MCG/5ML IJ SOLN
INTRAMUSCULAR | Status: AC
  Filled 2020-09-29: qty 5

## 2020-09-29 MED ORDER — EPHEDRINE SULFATE-NACL 50-0.9 MG/10ML-% IV SOSY
PREFILLED_SYRINGE | INTRAVENOUS | Status: DC | PRN
  Administered 2020-09-29 (×2): 10 mg via INTRAVENOUS

## 2020-09-29 MED ORDER — SODIUM CHLORIDE (PF) 0.9 % IJ SOLN
INTRAMUSCULAR | Status: AC
  Filled 2020-09-29: qty 20

## 2020-09-29 MED ORDER — IBUPROFEN 800 MG PO TABS
800.0000 mg | ORAL_TABLET | Freq: Three times a day (TID) | ORAL | Status: DC
  Administered 2020-09-30: 11:00:00 800 mg via ORAL
  Filled 2020-09-29 (×2): qty 1

## 2020-09-29 MED ORDER — MIDAZOLAM HCL 2 MG/2ML IJ SOLN
INTRAMUSCULAR | Status: AC
  Filled 2020-09-29: qty 2

## 2020-09-29 MED ORDER — FENTANYL CITRATE (PF) 100 MCG/2ML IJ SOLN
INTRAMUSCULAR | Status: DC | PRN
  Administered 2020-09-29: 100 ug via INTRAVENOUS
  Administered 2020-09-29 (×3): 50 ug via INTRAVENOUS

## 2020-09-29 MED ORDER — IOHEXOL 300 MG/ML  SOLN
100.0000 mL | Freq: Once | INTRAMUSCULAR | Status: AC | PRN
  Administered 2020-09-29: 15:00:00 100 mL via INTRAVENOUS

## 2020-09-29 MED ORDER — ONDANSETRON HCL 4 MG/2ML IJ SOLN
INTRAMUSCULAR | Status: AC
  Filled 2020-09-29: qty 2

## 2020-09-29 MED ORDER — ONDANSETRON HCL 4 MG/2ML IJ SOLN: 4.0000 mg | Freq: Four times a day (QID) | INTRAMUSCULAR | Status: DC | PRN

## 2020-09-29 MED ORDER — HYDROMORPHONE HCL 1 MG/ML IJ SOLN: 0.2500 mg | INTRAMUSCULAR | Status: DC | PRN

## 2020-09-29 SURGICAL SUPPLY — 9 items
DRSG PAD ABDOMINAL 8X10 ST (GAUZE/BANDAGES/DRESSINGS) ×4 IMPLANT
DRSG TEGADERM 8X12 (GAUZE/BANDAGES/DRESSINGS) ×2 IMPLANT
GAUZE PACKING 1/2X5YD (GAUZE/BANDAGES/DRESSINGS) ×2 IMPLANT
GAUZE SPONGE 4X4 12PLY STRL (GAUZE/BANDAGES/DRESSINGS) ×2 IMPLANT
STAPLER VISISTAT (STAPLE) ×2 IMPLANT
SUT PROLENE 3 0 PS 1 (SUTURE) ×8 IMPLANT
SUT PROLENE 3 0 PS 2 (SUTURE) ×6 IMPLANT
SUT VIC AB 3-0 SH 18 (SUTURE) ×2 IMPLANT
TOWEL OR 17X26 10 PK STRL BLUE (TOWEL DISPOSABLE) ×2 IMPLANT
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
## 2020-09-29 NOTE — ED Notes (Signed)
CHG bath given and gown changed for surgery

## 2020-09-29 NOTE — Transfer of Care (Signed)
Immediate Anesthesia Transfer of Care Note  Patient: Jordan Lamb  Procedure(s) Performed: IRRIGATION AND DEBRIDEMENT, WASHOUT OF LEFT FLANK AND BUTTOCKS (Left )  Patient Location: PACU  Anesthesia Type:General  Level of Consciousness: sedated, patient cooperative and responds to stimulation  Airway & Oxygen Therapy: Patient Spontanous Breathing and Patient connected to face mask oxygen  Post-op Assessment: Report given to RN and Post -op Vital signs reviewed and stable  Post vital signs: Reviewed and stable  Last Vitals:  Vitals Value Taken Time  BP 127/77 09/29/20 1901  Temp    Pulse 65 09/29/20 1903  Resp    SpO2 100 % 09/29/20 1903  Vitals shown include unvalidated device data.  Last Pain:  Vitals:   09/29/20 1518  TempSrc:   PainSc: 2          Complications: No complications documented.

## 2020-09-29 NOTE — ED Notes (Signed)
Pt to OR via stretcher with belongings.
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
## 2020-09-29 NOTE — Anesthesia Procedure Notes (Signed)
Procedure Name: Intubation Date/Time: 09/29/2020 4:55 PM Performed by: Montel Clock, CRNA Pre-anesthesia Checklist: Patient identified, Emergency Drugs available, Suction available, Patient being monitored and Timeout performed Patient Re-evaluated:Patient Re-evaluated prior to induction Oxygen Delivery Method: Circle system utilized Preoxygenation: Pre-oxygenation with 100% oxygen Induction Type: IV induction, Rapid sequence and Cricoid Pressure applied Laryngoscope Size: Mac and 4 Grade View: Grade I Tube type: Oral Tube size: 7.5 mm Number of attempts: 1 Airway Equipment and Method: Stylet Placement Confirmation: ETT inserted through vocal cords under direct vision,  positive ETCO2 and breath sounds checked- equal and bilateral Secured at: 23 cm Tube secured with: Tape Dental Injury: Teeth and Oropharynx as per pre-operative assessment
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
## 2020-09-29 NOTE — Progress Notes (Signed)
Pt smoking in room at this time. Instructed pt that this is a smoke free hospital(no smoking or vaping). Instructed pt that if he smokes again, I will have to call security. Cigarette lighter removed and placed in shadow chart. I flushed cigarette down toilet. No other cigarettes found at this time

## 2020-09-29 NOTE — ED Notes (Signed)
FAST exam completed at bedside per Dr. Audley Hose

## 2020-09-29 NOTE — ED Notes (Signed)
Phone report given to [facility] in short stay.
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
## 2020-09-29 NOTE — ED Notes (Signed)
Pt belongings obtained.Wallace Cullens pants with black belt, burgundy shirt, big leather jacket, cell phone, lighter, and 5 black bracelets left with pt at bedside.

## 2020-09-29 NOTE — Progress Notes (Signed)
Call from lab about surgical PCR being positive for MRSA. Called into OR 1 to inform Dr Andrey Campanile.

## 2020-09-30 LAB — CBC
HCT: 40.4 % (ref 39.0–52.0)
Hemoglobin: 12.9 g/dL — ABNORMAL LOW (ref 13.0–17.0)
MCH: 28.4 pg (ref 26.0–34.0)
MCHC: 31.9 g/dL (ref 30.0–36.0)
MCV: 88.8 fL (ref 80.0–100.0)
Platelets: 263 10*3/uL (ref 150–400)
RBC: 4.55 MIL/uL (ref 4.22–5.81)
RDW: 14.9 % (ref 11.5–15.5)
WBC: 11.5 10*3/uL — ABNORMAL HIGH (ref 4.0–10.5)
nRBC: 0 % (ref 0.0–0.2)

## 2020-09-30 LAB — BASIC METABOLIC PANEL
Anion gap: 6 (ref 5–15)
BUN: 5 mg/dL — ABNORMAL LOW (ref 6–20)
CO2: 24 mmol/L (ref 22–32)
Calcium: 8.8 mg/dL — ABNORMAL LOW (ref 8.9–10.3)
Chloride: 106 mmol/L (ref 98–111)
Creatinine, Ser: 0.81 mg/dL (ref 0.61–1.24)
GFR, Estimated: >60 mL/min (ref >60)
Glucose, Bld: 183 mg/dL — ABNORMAL HIGH (ref 70–99)
Potassium: 3.7 mmol/L (ref 3.5–5.1)
Sodium: 136 mmol/L (ref 135–145)

## 2020-09-30 LAB — HIV ANTIBODY (ROUTINE TESTING W REFLEX): HIV Screen 4th Generation wRfx: NONREACTIVE

## 2020-09-30 MED ORDER — CHLORHEXIDINE GLUCONATE CLOTH 2 % EX PADS: 6.0000 | MEDICATED_PAD | Freq: Every day | CUTANEOUS | Status: DC

## 2020-09-30 MED ORDER — OXYCODONE HCL 5 MG PO TABS
5.0000 mg | ORAL_TABLET | Freq: Four times a day (QID) | ORAL | 0 refills | Status: AC | PRN
Start: 2020-09-30 — End: ?

## 2020-09-30 MED ORDER — CEPHALEXIN 500 MG PO CAPS
500.0000 mg | ORAL_CAPSULE | Freq: Once | ORAL | Status: AC
  Administered 2020-09-30: 11:00:00 500 mg via ORAL
  Filled 2020-09-30: qty 1

## 2020-09-30 MED ORDER — MUPIROCIN 2 % EX OINT: 1.0000 "application " | TOPICAL_OINTMENT | Freq: Two times a day (BID) | CUTANEOUS | Status: DC

## 2020-09-30 MED ORDER — METHOCARBAMOL 500 MG PO TABS: 500.0000 mg | ORAL_TABLET | Freq: Three times a day (TID) | ORAL | 0 refills | Status: AC | PRN

## 2020-09-30 NOTE — Progress Notes (Signed)
Discharge instructions reviewed with patient and all questions answered.  Paper copy of instructions given to patient.   Dressing changed and wicks pulled. 2 peripheral IVs removed. Catheters intact. Sites unremarkable.  Wound edges approximated, 28 cm long. (one space between stitches at flexion of hip is < 1 cm from approximated). Covered with non-stick gauze pads, ABD, paper tape (minimal), curlex and mesh panties.  Pt does not want oxycodone. Will pick up Robaxin prescription at listed pharmacy and take Tylenol/ibuprofen at home for pain.  Pt walked out with mother to d/c home in private vehicle.
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
## 2020-09-30 NOTE — Discharge Instructions (Signed)
Laceration Care, Adult A laceration is a cut that may go through all layers of the skin. The cut may also go into the tissue that is right under the skin. Some cuts heal on their own. Others need to be closed with stitches (sutures), staples, skin adhesive strips, or skin glue. Taking care of your injury lowers your risk of infection, helps your injury to heal better, and may prevent scarring. Supplies needed:  Soap.  Water.  Hand sanitizer.  Bandage (dressing).  Antibiotic ointment.  Clean towel. How to take care of your cut Wash your hands with soap and water before touching your wound or changing your bandage. If soap and water are not available, use hand sanitizer. If your doctor used stitches or staples:  Keep the wound clean and dry.  If you were given a bandage, change it at least once a day as told by your doctor. You should also change it if it gets wet or dirty.  Keep the wound completely dry for the first 24 hours, or as told by your doctor. After that, you may take a shower or a bath. Do not get the wound soaked in water until after the stitches or staples have been removed.  Clean the wound once a day, or as told by your doctor: ? Wash the wound with soap and water. ? Rinse the wound with water to remove all soap. ? Pat the wound dry with a clean towel. Do not rub the wound.  After you clean the wound, put a thin layer of antibiotic ointment on it as told by your doctor. This ointment: ? Helps to prevent infection. ? Keeps the bandage from sticking to the wound.  Have your stitches or staples removed as told by your doctor. If your doctor used skin adhesive strips:  Keep the wound clean and dry.  If you were given a bandage, you should change it at least once a day as told by your doctor. You should also change it if it gets wet or dirty.  Do not get the skin adhesive strips wet. You can take a shower or a bath, but keep the wound dry.  If the wound gets wet,  pat it dry with a clean towel. Do not rub the wound.  Skin adhesive strips fall off on their own. You can trim the strips as the wound heals. Do not remove any strips that are still stuck to the wound. They will fall off after a while. If your doctor used skin glue:  Try to keep your wound dry, but you may briefly wet it in the shower or bath. Do not soak the wound in water, such as by swimming.  After you take a shower or a bath, gently pat the wound dry with a clean towel. Do not rub the wound.  Do not do any activities that will make you really sweaty until the skin glue has fallen off on its own.  Do not apply liquid, cream, or ointment medicine to your wound while the skin glue is still on.  If you were given a bandage, you should change it at least once a day or as told by your doctor. You should also change it if it gets dirty or wet.  If a bandage is placed over the wound, do not let the tape touch the skin glue.  Do not pick at the glue. The skin glue usually stays on for 5-10 days. Then, it falls off the skin. General   instructions   Take over-the-counter and prescription medicines only as told by your doctor.  If you were given antibiotic medicine or ointment, take or apply it as told by your doctor. Do not stop using it even if your condition improves.  Do not scratch or pick at the wound.  Check your wound every day for signs of infection. Watch for: ? Redness, swelling, or pain. ? Fluid, blood, or pus.  Raise (elevate) the injured area above the level of your heart while you are sitting or lying down.  If directed, put ice on the affected area: ? Put ice in a plastic bag. ? Place a towel between your skin and the bag. ? Leave the ice on for 20 minutes, 2-3 times a day.  Prevent scarring by covering your wound with sunscreen of at least 30 SPF whenever you are outside after your wound has healed.  Keep all follow-up visits as told by your doctor. This is  important. Get help if:  You got a tetanus shot and you have any of these problems at the injection site: ? Swelling. ? Very bad pain. ? Redness. ? Bleeding.  You have a fever.  A wound that was closed breaks open.  You notice a bad smell coming from your wound or your bandage.  You notice something coming out of the wound, such as wood or glass.  Medicine does not relieve your pain.  You have more redness, swelling, or pain at the site of your wound.  You have fluid, blood, or pus coming from your wound.  You notice a change in the color of your skin near your wound.  You need to change the bandage often because fluid, blood, or pus is coming from the wound.  You start to have a new rash.  You start to have numbness around the wound. Get help right away if:  You have very bad swelling around the wound.  Your pain suddenly gets worse and is very bad.  You notice painful lumps near the wound or anywhere on your body.  You have a red streak going away from your wound.  The wound is on your hand or foot, and: ? You cannot move a finger or toe. ? Your fingers or toes look pale or bluish. Summary  A laceration is a cut that may go through all layers of the skin. The cut may also go into the tissue right under the skin.  Some cuts heal on their own. Others need to be closed with stitches, staples, skin adhesive strips, or skin glue.  Follow your doctor's instructions for caring for your cut. Proper care of a cut lowers the risk of infection, helps the cut heal better, and prevents scarring. This information is not intended to replace advice given to you by your health care provider. Make sure you discuss any questions you have with your health care provider. Document Revised: 11/22/2017 Document Reviewed: 10/14/2017 Elsevier Patient Education  2020 [employer]  

## 2020-10-03 ENCOUNTER — Encounter (HOSPITAL_COMMUNITY): Payer: Self-pay | Admitting: General Surgery

## 2020-10-03 NOTE — Anesthesia Postprocedure Evaluation (Signed)
Anesthesia Post Note  Patient: Jordan Lamb  Procedure(s) Performed: IRRIGATION AND DEBRIDEMENT, WASHOUT OF LEFT FLANK AND BUTTOCKS (Left )     Patient location during evaluation: PACU Anesthesia Type: General Level of consciousness: awake and alert Pain management: pain level controlled Vital Signs Assessment: post-procedure vital signs reviewed and stable Respiratory status: spontaneous breathing, nonlabored ventilation, respiratory function stable and patient connected to nasal cannula oxygen Cardiovascular status: blood pressure returned to baseline and stable Postop Assessment: no apparent nausea or vomiting Anesthetic complications: no   No complications documented.  Last Vitals:  Vitals:   09/30/20 0038 09/30/20 0414  BP: 130/90 116/69  Pulse: 73 67  Resp: 17 18  Temp: 36.7 C 36.8 C  SpO2: 100% 99%    Last Pain:  Vitals:   09/30/20 0038  TempSrc: Oral  PainSc:                  [name]

## 2020-10-13 ENCOUNTER — Other Ambulatory Visit: Payer: Self-pay

## 2020-10-13 ENCOUNTER — Encounter (HOSPITAL_COMMUNITY): Payer: Self-pay

## 2020-10-13 ENCOUNTER — Emergency Department (HOSPITAL_COMMUNITY)
Admission: EM | Admit: 2020-10-13 | Discharge: 2020-10-13 | Disposition: A | Discharge status: Home / Self Care | Payer: Self-pay | Attending: Emergency Medicine | Admitting: Emergency Medicine

## 2020-10-13 DIAGNOSIS — Z5189 Encounter for other specified aftercare: Secondary | ICD-10-CM

## 2020-10-13 DIAGNOSIS — Z4800 Encounter for change or removal of nonsurgical wound dressing: Secondary | ICD-10-CM | POA: Insufficient documentation

## 2020-10-13 DIAGNOSIS — F17210 Nicotine dependence, cigarettes, uncomplicated: Secondary | ICD-10-CM | POA: Insufficient documentation

## 2020-10-13 DIAGNOSIS — W3400XD Accidental discharge from unspecified firearms or gun, subsequent encounter: Secondary | ICD-10-CM | POA: Insufficient documentation

## 2020-10-13 DIAGNOSIS — S71002D Unspecified open wound, left hip, subsequent encounter: Secondary | ICD-10-CM | POA: Insufficient documentation

## 2020-10-13 NOTE — ED Notes (Signed)
Wet to dry dressing applied to open wound to right lower back/side as directed by Claudette Stapler, PA.
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
## 2020-11-19 ENCOUNTER — Emergency Department (HOSPITAL_COMMUNITY)
Admission: EM | Admit: 2020-11-19 | Discharge: 2020-11-20 | Disposition: A | Discharge status: Home / Self Care | Payer: Self-pay | Attending: Emergency Medicine | Admitting: Emergency Medicine

## 2020-11-19 ENCOUNTER — Other Ambulatory Visit: Payer: Self-pay

## 2020-11-19 ENCOUNTER — Encounter (HOSPITAL_COMMUNITY): Payer: Self-pay | Admitting: Emergency Medicine

## 2020-11-19 DIAGNOSIS — I456 Pre-excitation syndrome: Secondary | ICD-10-CM | POA: Insufficient documentation

## 2020-11-19 DIAGNOSIS — F17210 Nicotine dependence, cigarettes, uncomplicated: Secondary | ICD-10-CM | POA: Insufficient documentation

## 2020-11-19 DIAGNOSIS — R404 Transient alteration of awareness: Secondary | ICD-10-CM | POA: Insufficient documentation

## 2020-11-19 DIAGNOSIS — M791 Myalgia, unspecified site: Secondary | ICD-10-CM | POA: Insufficient documentation

## 2020-11-19 LAB — CBC WITH DIFFERENTIAL/PLATELET
Abs Immature Granulocytes: 0.03 10*3/uL (ref 0.00–0.07)
Basophils Absolute: 0.1 10*3/uL (ref 0.0–0.1)
Basophils Relative: 1 %
Eosinophils Absolute: 0.2 10*3/uL (ref 0.0–0.5)
Eosinophils Relative: 2 %
HCT: 36.4 % — ABNORMAL LOW (ref 39.0–52.0)
Hemoglobin: 11.6 g/dL — ABNORMAL LOW (ref 13.0–17.0)
Immature Granulocytes: 0 %
Lymphocytes Relative: 27 %
Lymphs Abs: 2.7 10*3/uL (ref 0.7–4.0)
MCH: 27.8 pg (ref 26.0–34.0)
MCHC: 31.9 g/dL (ref 30.0–36.0)
MCV: 87.3 fL (ref 80.0–100.0)
Monocytes Absolute: 1 10*3/uL (ref 0.1–1.0)
Monocytes Relative: 11 %
Neutro Abs: 5.9 10*3/uL (ref 1.7–7.7)
Neutrophils Relative %: 59 %
Platelets: 271 10*3/uL (ref 150–400)
RBC: 4.17 MIL/uL — ABNORMAL LOW (ref 4.22–5.81)
RDW: 14.9 % (ref 11.5–15.5)
WBC: 9.9 10*3/uL (ref 4.0–10.5)
nRBC: 0 % (ref 0.0–0.2)

## 2020-11-19 LAB — COMPREHENSIVE METABOLIC PANEL
ALT: 14 U/L (ref 0–44)
AST: 20 U/L (ref 15–41)
Albumin: 3.6 g/dL (ref 3.5–5.0)
Alkaline Phosphatase: 53 U/L (ref 38–126)
Anion gap: 9 (ref 5–15)
BUN: 15 mg/dL (ref 6–20)
CO2: 23 mmol/L (ref 22–32)
Calcium: 8.5 mg/dL — ABNORMAL LOW (ref 8.9–10.3)
Chloride: 108 mmol/L (ref 98–111)
Creatinine, Ser: 1.11 mg/dL (ref 0.61–1.24)
GFR, Estimated: >60 mL/min (ref >60)
Glucose, Bld: 70 mg/dL (ref 70–99)
Potassium: 3.5 mmol/L (ref 3.5–5.1)
Sodium: 140 mmol/L (ref 135–145)
Total Bilirubin: 1 mg/dL (ref 0.3–1.2)
Total Protein: 6.3 g/dL — ABNORMAL LOW (ref 6.5–8.1)

## 2020-11-19 LAB — CK: Total CK: 245 U/L (ref 49–397)

## 2020-11-19 MED ORDER — LORAZEPAM 2 MG/ML IJ SOLN: 2.0000 mg | Freq: Once | INTRAMUSCULAR | Status: DC

## 2020-11-19 MED ORDER — SODIUM CHLORIDE 0.9 % IV BOLUS
1000.0000 mL | Freq: Once | INTRAVENOUS | Status: AC
  Administered 2020-11-19: 1000 mL via INTRAVENOUS

## 2020-11-19 MED ORDER — LORAZEPAM 2 MG/ML IJ SOLN
2.0000 mg | Freq: Once | INTRAMUSCULAR | Status: AC
  Administered 2020-11-19: 2 mg via INTRAVENOUS
  Filled 2020-11-19: qty 1
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
## 2020-11-20 NOTE — ED Provider Notes (Signed)
I received the patient in signout from Dr. Rhunette Croft.  Briefly the patient is a 35 year old male with methamphetamine intoxication and agitation.  Improved with Ativan.  Plan to observe until improvement of mental status.  Patient is now awake and asking for discharge.   Melene Plan, DO 11/20/20 [phone]
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
## 2021-06-04 ENCOUNTER — Emergency Department (HOSPITAL_COMMUNITY)
Admission: EM | Admit: 2021-06-04 | Discharge: 2021-06-05 | Discharge status: Against Medical Advice | Payer: Self-pay | Attending: Emergency Medicine | Admitting: Emergency Medicine

## 2021-06-04 ENCOUNTER — Other Ambulatory Visit: Payer: Self-pay

## 2021-06-04 ENCOUNTER — Encounter (HOSPITAL_COMMUNITY): Payer: Self-pay | Admitting: Emergency Medicine

## 2021-06-04 DIAGNOSIS — X58XXXA Exposure to other specified factors, initial encounter: Secondary | ICD-10-CM | POA: Insufficient documentation

## 2021-06-04 DIAGNOSIS — F17210 Nicotine dependence, cigarettes, uncomplicated: Secondary | ICD-10-CM | POA: Insufficient documentation

## 2021-06-04 DIAGNOSIS — S0181XA Laceration without foreign body of other part of head, initial encounter: Secondary | ICD-10-CM | POA: Insufficient documentation

## 2021-06-04 MED ORDER — TETANUS-DIPHTH-ACELL PERTUSSIS 5-2.5-18.5 LF-MCG/0.5 IM SUSY: 0.5000 mL | PREFILLED_SYRINGE | Freq: Once | INTRAMUSCULAR | Status: DC

## 2021-06-04 NOTE — ED Notes (Addendum)
Pt seen leaving the department. 
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
## 2021-06-04 NOTE — ED Notes (Signed)
Patient expressed desire to leave, states he does not want the CT. Risks of leaving discussed with patient, patient verablized understanding. Patient ambulatory out of department with steady gait.

## 2021-06-05 ENCOUNTER — Ambulatory Visit (HOSPITAL_COMMUNITY): Admission: RE | Admit: 2021-06-05 | Payer: No Typology Code available for payment source | Source: Ambulatory Visit

## 2021-06-05 ENCOUNTER — Emergency Department (HOSPITAL_COMMUNITY): Payer: Self-pay
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
## 2023-02-25 ENCOUNTER — Other Ambulatory Visit: Payer: Self-pay

## 2023-02-25 ENCOUNTER — Encounter (HOSPITAL_COMMUNITY): Payer: Self-pay

## 2023-02-25 ENCOUNTER — Emergency Department (HOSPITAL_COMMUNITY)
Admission: EM | Admit: 2023-02-25 | Discharge: 2023-02-25 | Disposition: A | Discharge status: Home / Self Care | Payer: 59 | Attending: Emergency Medicine | Admitting: Emergency Medicine

## 2023-02-25 DIAGNOSIS — L089 Local infection of the skin and subcutaneous tissue, unspecified: Secondary | ICD-10-CM | POA: Insufficient documentation

## 2023-02-25 DIAGNOSIS — W228XXA Striking against or struck by other objects, initial encounter: Secondary | ICD-10-CM | POA: Diagnosis not present

## 2023-02-25 DIAGNOSIS — R519 Headache, unspecified: Secondary | ICD-10-CM | POA: Diagnosis present

## 2023-02-25 MED ORDER — BACITRACIN ZINC 500 UNIT/GM EX OINT: 1.0000 | TOPICAL_OINTMENT | Freq: Two times a day (BID) | CUTANEOUS | 0 refills | Status: AC

## 2023-02-25 MED ORDER — DOXYCYCLINE HYCLATE 100 MG PO CAPS: 100.0000 mg | ORAL_CAPSULE | Freq: Two times a day (BID) | ORAL | 0 refills | Status: DC
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
## 2023-02-25 NOTE — Discharge Instructions (Addendum)
Please take your antibiotics as prescribed. Take tylenol/ibuprofen for pain. I recommend close follow-up with PCP for reevaluation.  Please do not hesitate to return to emergency department if worrisome signs symptoms we discussed become apparent.  

## 2023-02-25 NOTE — ED Provider Notes (Signed)
[place] EMERGENCY DEPARTMENT AT [facility] Provider Note   CSN: [id number] Arrival date & time: 02/25/23  7829     History  Chief Complaint  Patient presents with   Facial Swelling    Jordan Lamb is a 37 y.o. male history of bipolar 2 disorder, presents today for evaluation of facial pain.  He reports he was weeding on Friday last week when a rock flew and hit the left side of his left nose.  He reports green-yellowish drainage in the last couple days.  Has tried Tylenol and ibuprofen for pain with minimal relief.  He denies any fever, headache, vision changes, cough, chest pain, shortness of breath, nosebleeding.  HPI    Past Medical History:  Diagnosis Date   Bipolar 2 disorder (HCC)    Superficial thrombophlebitis    Wolff-Parkinson-White syndrome    Past Surgical History:  Procedure Laterality Date   IRRIGATION AND DEBRIDEMENT BUTTOCKS Left 09/29/2020   Procedure: IRRIGATION AND DEBRIDEMENT, WASHOUT OF LEFT FLANK AND BUTTOCKS;  Surgeon: Gaynelle Adu, MD;  Location: [facility];  Service: General;  Laterality: Left;   TONSILLECTOMY       Home Medications Prior to Admission medications   Medication Sig Start Date End Date Taking? Authorizing Provider  LITHIUM PO Take 120 mg by mouth at bedtime.    [provider]  methocarbamol (ROBAXIN) 500 MG tablet Take 1 tablet (500 mg total) by mouth every 8 (eight) hours as needed for muscle spasms. Patient not taking: Reported on 11/20/2020 09/30/20   Jacinto Halim, PA-C  oxyCODONE (OXY IR/ROXICODONE) 5 MG immediate release tablet Take 1 tablet (5 mg total) by mouth every 6 (six) hours as needed for severe pain. Patient not taking: Reported on 11/20/2020 09/30/20   Almond Lint, MD      Allergies    Patient has no known allergies.    Review of Systems   Review of Systems Negative except as per HPI.  Physical Exam Updated Vital Signs BP (!) 148/96 (BP Location: Left Arm)   Pulse 99   Temp 98.1  F (36.7 C) (Oral)   Resp 16   Wt 79 kg   SpO2 100%   BMI 22.36 kg/m  Physical Exam Vitals and nursing note reviewed.  Constitutional:      Appearance: Normal appearance.  HENT:     Head: Normocephalic and atraumatic.     Nose:     Comments: Swelling and erythema noted to the front of the left nare.  No active bleeding, pus or fluid drainage on my examination.    Mouth/Throat:     Mouth: Mucous membranes are moist.  Eyes:     General: No scleral icterus. Cardiovascular:     Rate and Rhythm: Normal rate and regular rhythm.     Pulses: Normal pulses.     Heart sounds: Normal heart sounds.  Pulmonary:     Effort: Pulmonary effort is normal.     Breath sounds: Normal breath sounds.  Abdominal:     General: Abdomen is flat.     Palpations: Abdomen is soft.     Tenderness: There is no abdominal tenderness.  Musculoskeletal:        General: No deformity.  Skin:    General: Skin is warm.     Findings: No rash.  Neurological:     General: No focal deficit present.     Mental Status: He is alert.  Psychiatric:        Mood and  Affect: Mood normal.     ED Results / Procedures / Treatments   Labs (all labs ordered are listed, but only abnormal results are displayed) Labs Reviewed - No data to display  EKG None  Radiology No results found.  Procedures Procedures    Medications Ordered in ED Medications - No data to display  ED Course/ Medical Decision Making/ A&P                             Medical Decision Making  This patient presents to the ED for facial pain, this involves an extensive number of treatment options, and is a complaint that carries with a high risk of complications and morbidity.  The differential diagnosis includes fracture, cellulitis, sinusitis.  This is not an exhaustive list.  Problem list/ ED course/ Critical interventions/ Medical management: HPI: See above Vital signs within normal range and stable throughout  visit. Laboratory/imaging studies significant for: See above. On physical examination, patient is afebrile and appears in no acute distress.  There was swelling and overlying skin erythema noted to the front of the left nare.  No bleeding, pus or fluid drainage on my examination.  No lymphangitic spreading visible and no fluid pockets or fluctuance concerning for abscess noted. Low fracture for fracture.  He reports greenish-yellow discharge from the L nare in the last couple days. Patient to be discharged home with doxy with follow up with their PCP. I have reviewed the patient home medicines and have made adjustments as needed.  Cardiac monitoring/EKG: The patient was maintained on a cardiac monitor.  I personally reviewed and interpreted the cardiac monitor which showed an underlying rhythm of: sinus rhythm.  Additional history obtained: External records from outside source obtained and reviewed including: Chart review including previous notes, labs, imaging.  Consultations obtained:  Disposition Continued outpatient therapy. Follow-up with PCP recommended for reevaluation of symptoms. Treatment plan discussed with patient.  Pt acknowledged understanding was agreeable to the plan. Worrisome signs and symptoms were discussed with patient, and patient acknowledged understanding to return to the ED if they noticed these signs and symptoms. Patient was stable upon discharge.   This chart was dictated using voice recognition software.  Despite best efforts to proofread,  errors can occur which can change the documentation meaning.          Final Clinical Impression(s) / ED Diagnoses Final diagnoses:  Facial pain  Skin infection    Rx / DC Orders ED Discharge Orders          Ordered    doxycycline (VIBRAMYCIN) 100 MG capsule  2 times daily        02/25/23 1111    bacitracin ointment  2 times daily        02/25/23 1111              Jeanelle Malling, [place] 02/25/23 1121    Ernie Avena, MD 02/25/23 [phone]

## 2023-07-12 ENCOUNTER — Encounter (HOSPITAL_BASED_OUTPATIENT_CLINIC_OR_DEPARTMENT_OTHER): Payer: Self-pay | Admitting: Emergency Medicine

## 2023-07-12 ENCOUNTER — Emergency Department (HOSPITAL_BASED_OUTPATIENT_CLINIC_OR_DEPARTMENT_OTHER)
Admission: EM | Admit: 2023-07-12 | Discharge: 2023-07-12 | Discharge status: Against Medical Advice | Payer: 59 | Attending: Emergency Medicine | Admitting: Emergency Medicine

## 2023-07-12 ENCOUNTER — Other Ambulatory Visit: Payer: Self-pay

## 2023-07-12 DIAGNOSIS — S90562A Insect bite (nonvenomous), left ankle, initial encounter: Secondary | ICD-10-CM | POA: Insufficient documentation

## 2023-07-12 DIAGNOSIS — Z5321 Procedure and treatment not carried out due to patient leaving prior to being seen by health care provider: Secondary | ICD-10-CM | POA: Insufficient documentation

## 2023-07-12 DIAGNOSIS — W57XXXA Bitten or stung by nonvenomous insect and other nonvenomous arthropods, initial encounter: Secondary | ICD-10-CM | POA: Insufficient documentation

## 2023-07-12 NOTE — ED Triage Notes (Addendum)
Patient reports being bitten by unknown insect 2 days ago on left ankle. Since then the site has become swollen and red. Patient took friends antibiotic, unknown which one, but states they have not helped. Denies fevers.

## 2023-09-04 ENCOUNTER — Ambulatory Visit
Admission: EM | Admit: 2023-09-04 | Discharge: 2023-09-04 | Disposition: A | Discharge status: Home / Self Care | Payer: 59

## 2023-09-04 DIAGNOSIS — L03211 Cellulitis of face: Secondary | ICD-10-CM

## 2023-09-04 MED ORDER — DOXYCYCLINE HYCLATE 100 MG PO CAPS: 100.0000 mg | ORAL_CAPSULE | Freq: Two times a day (BID) | ORAL | 0 refills | Status: AC
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
## 2023-09-04 NOTE — ED Triage Notes (Signed)
"  I am having facial swelling on the right side of my face, I recently was working on a car where debris was coming out of the car and landing on me/face". No eye injury or known foreign body in eye or mouth, I did get some stuff in my nose". "My nose/face and right cheek is killing me with some discharge (non mucous) from my right nostril". No fever.
